# Patient Record
Sex: Male | Born: 1950 | Race: White | Hispanic: No | Marital: Single | Smoking: Never smoker
Health system: Southern US, Community
[De-identification: ages and names within clinical notes are randomized; demographics above are authoritative.]

## PROBLEM LIST (undated history)

## (undated) DIAGNOSIS — E079 Disorder of thyroid, unspecified: Secondary | ICD-10-CM

## (undated) DIAGNOSIS — M5430 Sciatica, unspecified side: Secondary | ICD-10-CM

## (undated) DIAGNOSIS — E119 Type 2 diabetes mellitus without complications: Secondary | ICD-10-CM

## (undated) DIAGNOSIS — I10 Essential (primary) hypertension: Secondary | ICD-10-CM

## (undated) HISTORY — PX: FRACTURE SURGERY: SHX138

## (undated) HISTORY — PX: NECK SURGERY: SHX720

## (undated) HISTORY — PX: HERNIA REPAIR: SHX51

## (undated) HISTORY — DX: Sciatica, unspecified side: M54.30

---

## 1998-10-10 ENCOUNTER — Ambulatory Visit (HOSPITAL_BASED_OUTPATIENT_CLINIC_OR_DEPARTMENT_OTHER): Admission: RE | Admit: 1998-10-10 | Discharge: 1998-10-10 | Payer: Self-pay | Admitting: Orthopedic Surgery

## 1999-03-31 ENCOUNTER — Emergency Department (HOSPITAL_COMMUNITY): Admission: EM | Admit: 1999-03-31 | Discharge: 1999-04-01 | Payer: Self-pay | Admitting: Emergency Medicine

## 2001-04-25 ENCOUNTER — Encounter: Payer: Self-pay | Admitting: Endocrinology

## 2001-04-25 ENCOUNTER — Ambulatory Visit (HOSPITAL_COMMUNITY): Admission: RE | Admit: 2001-04-25 | Discharge: 2001-04-25 | Payer: Self-pay | Admitting: Endocrinology

## 2001-06-08 ENCOUNTER — Inpatient Hospital Stay (HOSPITAL_COMMUNITY): Admission: EM | Admit: 2001-06-08 | Discharge: 2001-06-09 | Payer: Self-pay | Admitting: Emergency Medicine

## 2001-06-08 ENCOUNTER — Encounter: Payer: Self-pay | Admitting: Emergency Medicine

## 2001-06-08 ENCOUNTER — Encounter: Payer: Self-pay | Admitting: Orthopedic Surgery

## 2002-07-17 ENCOUNTER — Encounter: Payer: Self-pay | Admitting: Endocrinology

## 2002-07-17 ENCOUNTER — Ambulatory Visit (HOSPITAL_COMMUNITY): Admission: RE | Admit: 2002-07-17 | Discharge: 2002-07-17 | Payer: Self-pay | Admitting: Endocrinology

## 2002-10-12 ENCOUNTER — Encounter: Payer: Self-pay | Admitting: Neurosurgery

## 2002-10-18 ENCOUNTER — Ambulatory Visit (HOSPITAL_COMMUNITY): Admission: RE | Admit: 2002-10-18 | Discharge: 2002-10-19 | Payer: Self-pay | Admitting: Neurosurgery

## 2002-10-18 ENCOUNTER — Encounter: Payer: Self-pay | Admitting: Neurosurgery

## 2002-12-25 ENCOUNTER — Encounter: Admission: RE | Admit: 2002-12-25 | Discharge: 2002-12-25 | Payer: Self-pay | Admitting: Neurosurgery

## 2002-12-25 ENCOUNTER — Encounter: Payer: Self-pay | Admitting: Neurosurgery

## 2003-10-01 ENCOUNTER — Encounter: Admission: RE | Admit: 2003-10-01 | Discharge: 2003-10-01 | Payer: Self-pay | Admitting: Internal Medicine

## 2003-10-02 ENCOUNTER — Encounter: Admission: RE | Admit: 2003-10-02 | Discharge: 2003-10-02 | Payer: Self-pay | Admitting: Internal Medicine

## 2003-11-14 ENCOUNTER — Ambulatory Visit (HOSPITAL_BASED_OUTPATIENT_CLINIC_OR_DEPARTMENT_OTHER): Admission: RE | Admit: 2003-11-14 | Discharge: 2003-11-14 | Payer: Self-pay | Admitting: Orthopedic Surgery

## 2003-11-14 ENCOUNTER — Ambulatory Visit (HOSPITAL_COMMUNITY): Admission: RE | Admit: 2003-11-14 | Discharge: 2003-11-14 | Payer: Self-pay | Admitting: Orthopedic Surgery

## 2011-07-20 ENCOUNTER — Other Ambulatory Visit: Payer: Self-pay | Admitting: Neurosurgery

## 2011-07-20 DIAGNOSIS — M542 Cervicalgia: Secondary | ICD-10-CM

## 2011-07-26 ENCOUNTER — Ambulatory Visit
Admission: RE | Admit: 2011-07-26 | Discharge: 2011-07-26 | Disposition: A | Discharge status: Home / Self Care | Payer: 59 | Source: Ambulatory Visit | Attending: Neurosurgery | Admitting: Neurosurgery

## 2011-07-26 DIAGNOSIS — M542 Cervicalgia: Secondary | ICD-10-CM
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
## 2013-04-30 DIAGNOSIS — R0609 Other forms of dyspnea: Secondary | ICD-10-CM | POA: Diagnosis not present

## 2013-04-30 DIAGNOSIS — R0989 Other specified symptoms and signs involving the circulatory and respiratory systems: Secondary | ICD-10-CM | POA: Diagnosis not present

## 2013-04-30 DIAGNOSIS — E119 Type 2 diabetes mellitus without complications: Secondary | ICD-10-CM | POA: Diagnosis not present

## 2013-04-30 DIAGNOSIS — F4321 Adjustment disorder with depressed mood: Secondary | ICD-10-CM | POA: Diagnosis not present

## 2013-07-09 DIAGNOSIS — G4700 Insomnia, unspecified: Secondary | ICD-10-CM | POA: Diagnosis not present

## 2013-07-09 DIAGNOSIS — R0609 Other forms of dyspnea: Secondary | ICD-10-CM | POA: Diagnosis not present

## 2013-07-09 DIAGNOSIS — R0989 Other specified symptoms and signs involving the circulatory and respiratory systems: Secondary | ICD-10-CM | POA: Diagnosis not present

## 2013-07-09 DIAGNOSIS — E789 Disorder of lipoprotein metabolism, unspecified: Secondary | ICD-10-CM | POA: Diagnosis not present

## 2013-07-09 DIAGNOSIS — E0789 Other specified disorders of thyroid: Secondary | ICD-10-CM | POA: Diagnosis not present

## 2013-10-08 DIAGNOSIS — R0609 Other forms of dyspnea: Secondary | ICD-10-CM | POA: Diagnosis not present

## 2013-10-08 DIAGNOSIS — E0789 Other specified disorders of thyroid: Secondary | ICD-10-CM | POA: Diagnosis not present

## 2013-10-08 DIAGNOSIS — E119 Type 2 diabetes mellitus without complications: Secondary | ICD-10-CM | POA: Diagnosis not present

## 2013-11-19 DIAGNOSIS — R109 Unspecified abdominal pain: Secondary | ICD-10-CM | POA: Diagnosis not present

## 2013-11-19 DIAGNOSIS — H9190 Unspecified hearing loss, unspecified ear: Secondary | ICD-10-CM | POA: Diagnosis not present

## 2013-11-19 DIAGNOSIS — E119 Type 2 diabetes mellitus without complications: Secondary | ICD-10-CM | POA: Diagnosis not present

## 2014-01-21 DIAGNOSIS — E119 Type 2 diabetes mellitus without complications: Secondary | ICD-10-CM | POA: Diagnosis not present

## 2014-01-21 DIAGNOSIS — F4320 Adjustment disorder, unspecified: Secondary | ICD-10-CM | POA: Diagnosis not present

## 2014-01-21 DIAGNOSIS — E7889 Other lipoprotein metabolism disorders: Secondary | ICD-10-CM | POA: Diagnosis not present

## 2014-01-21 DIAGNOSIS — E039 Hypothyroidism, unspecified: Secondary | ICD-10-CM | POA: Diagnosis not present

## 2014-03-20 DIAGNOSIS — E118 Type 2 diabetes mellitus with unspecified complications: Secondary | ICD-10-CM | POA: Diagnosis not present

## 2014-03-20 DIAGNOSIS — I10 Essential (primary) hypertension: Secondary | ICD-10-CM | POA: Diagnosis not present

## 2014-08-31 ENCOUNTER — Encounter (HOSPITAL_COMMUNITY): Payer: Self-pay | Admitting: Emergency Medicine

## 2014-08-31 ENCOUNTER — Emergency Department (HOSPITAL_COMMUNITY): Payer: 59

## 2014-08-31 ENCOUNTER — Emergency Department (HOSPITAL_COMMUNITY)
Admission: EM | Admit: 2014-08-31 | Discharge: 2014-09-01 | Disposition: A | Discharge status: Home / Self Care | Payer: 59 | Attending: Emergency Medicine | Admitting: Emergency Medicine

## 2014-08-31 DIAGNOSIS — L559 Sunburn, unspecified: Secondary | ICD-10-CM | POA: Diagnosis not present

## 2014-08-31 DIAGNOSIS — Z79899 Other long term (current) drug therapy: Secondary | ICD-10-CM | POA: Diagnosis not present

## 2014-08-31 DIAGNOSIS — R1031 Right lower quadrant pain: Secondary | ICD-10-CM | POA: Insufficient documentation

## 2014-08-31 DIAGNOSIS — N400 Enlarged prostate without lower urinary tract symptoms: Secondary | ICD-10-CM | POA: Diagnosis not present

## 2014-08-31 DIAGNOSIS — E119 Type 2 diabetes mellitus without complications: Secondary | ICD-10-CM | POA: Diagnosis not present

## 2014-08-31 DIAGNOSIS — R1030 Lower abdominal pain, unspecified: Secondary | ICD-10-CM | POA: Diagnosis not present

## 2014-08-31 DIAGNOSIS — I10 Essential (primary) hypertension: Secondary | ICD-10-CM | POA: Diagnosis not present

## 2014-08-31 DIAGNOSIS — E079 Disorder of thyroid, unspecified: Secondary | ICD-10-CM | POA: Insufficient documentation

## 2014-08-31 DIAGNOSIS — R109 Unspecified abdominal pain: Secondary | ICD-10-CM

## 2014-08-31 DIAGNOSIS — N200 Calculus of kidney: Secondary | ICD-10-CM | POA: Diagnosis not present

## 2014-08-31 DIAGNOSIS — K8020 Calculus of gallbladder without cholecystitis without obstruction: Secondary | ICD-10-CM | POA: Diagnosis not present

## 2014-08-31 HISTORY — DX: Type 2 diabetes mellitus without complications: E11.9

## 2014-08-31 HISTORY — DX: Disorder of thyroid, unspecified: E07.9

## 2014-08-31 HISTORY — DX: Essential (primary) hypertension: I10

## 2014-08-31 LAB — COMPREHENSIVE METABOLIC PANEL
ALBUMIN: 4.2 g/dL (ref 3.5–5.0)
ALT: 24 U/L (ref 17–63)
AST: 29 U/L (ref 15–41)
Alkaline Phosphatase: 57 U/L (ref 38–126)
Anion gap: 11 (ref 5–15)
BUN: 15 mg/dL (ref 6–20)
CALCIUM: 9.9 mg/dL (ref 8.9–10.3)
CO2: 25 mmol/L (ref 22–32)
Chloride: 103 mmol/L (ref 101–111)
Creatinine, Ser: 0.95 mg/dL (ref 0.61–1.24)
GFR calc Af Amer: >60 mL/min (ref >60)
GFR calc non Af Amer: >60 mL/min (ref >60)
Glucose, Bld: 112 mg/dL — ABNORMAL HIGH (ref 65–99)
Potassium: 3.7 mmol/L (ref 3.5–5.1)
Sodium: 139 mmol/L (ref 135–145)
TOTAL PROTEIN: 7.7 g/dL (ref 6.5–8.1)
Total Bilirubin: 0.7 mg/dL (ref 0.3–1.2)

## 2014-08-31 LAB — URINALYSIS, ROUTINE W REFLEX MICROSCOPIC
BILIRUBIN URINE: NEGATIVE
Glucose, UA: NEGATIVE mg/dL
HGB URINE DIPSTICK: NEGATIVE
Ketones, ur: NEGATIVE mg/dL
Leukocytes, UA: NEGATIVE
NITRITE: NEGATIVE
PH: 5.5 (ref 5.0–8.0)
Protein, ur: NEGATIVE mg/dL
SPECIFIC GRAVITY, URINE: 1.007 (ref 1.005–1.030)
Urobilinogen, UA: 0.2 mg/dL (ref 0.0–1.0)

## 2014-08-31 LAB — CBC WITH DIFFERENTIAL/PLATELET
BASOS ABS: 0.1 10*3/uL (ref 0.0–0.1)
Basophils Relative: 1 % (ref 0–1)
EOS PCT: 4 % (ref 0–5)
Eosinophils Absolute: 0.4 10*3/uL (ref 0.0–0.7)
HCT: 46.3 % (ref 39.0–52.0)
Hemoglobin: 16.4 g/dL (ref 13.0–17.0)
LYMPHS ABS: 3.3 10*3/uL (ref 0.7–4.0)
Lymphocytes Relative: 30 % (ref 12–46)
MCH: 32.5 pg (ref 26.0–34.0)
MCHC: 35.4 g/dL (ref 30.0–36.0)
MCV: 91.7 fL (ref 78.0–100.0)
Monocytes Absolute: 0.7 10*3/uL (ref 0.1–1.0)
Monocytes Relative: 7 % (ref 3–12)
Neutro Abs: 6.6 10*3/uL (ref 1.7–7.7)
Neutrophils Relative %: 60 % (ref 43–77)
Platelets: 160 10*3/uL (ref 150–400)
RBC: 5.05 MIL/uL (ref 4.22–5.81)
RDW: 13.3 % (ref 11.5–15.5)
WBC: 11.1 10*3/uL — ABNORMAL HIGH (ref 4.0–10.5)

## 2014-08-31 LAB — LIPASE, BLOOD: LIPASE: 37 U/L (ref 22–51)

## 2014-08-31 MED ORDER — SODIUM CHLORIDE 0.9 % IV BOLUS (SEPSIS)
1000.0000 mL | Freq: Once | INTRAVENOUS | Status: AC
  Administered 2014-08-31: 1000 mL via INTRAVENOUS

## 2014-08-31 MED ORDER — MORPHINE SULFATE 2 MG/ML IJ SOLN
2.0000 mg | Freq: Once | INTRAMUSCULAR | Status: AC
  Administered 2014-08-31: 2 mg via INTRAVENOUS
  Filled 2014-08-31: qty 1

## 2014-08-31 MED ORDER — ONDANSETRON HCL 4 MG/2ML IJ SOLN
4.0000 mg | Freq: Once | INTRAMUSCULAR | Status: AC
  Administered 2014-08-31: 4 mg via INTRAVENOUS
  Filled 2014-08-31: qty 2

## 2014-08-31 NOTE — ED Notes (Signed)
Denies any abd surgeries.  Sts the pain is evolving and getting worse.

## 2014-08-31 NOTE — ED Notes (Signed)
Back from CT

## 2014-08-31 NOTE — ED Notes (Signed)
Pt reports pain comes and goes but it's about 1/10 right now

## 2014-08-31 NOTE — ED Provider Notes (Signed)
CSN: [id number]     Arrival date & time 08/31/14  2027 History   First MD Initiated Contact with Patient 08/31/14 2031     Chief Complaint  Patient presents with  . Abdominal Pain     (Consider location/radiation/quality/duration/timing/severity/associated sxs/prior Treatment) The history is provided by the patient.   Mr. Cephas is a 64 year old male with PMHx of hypertension, diabetes, and HLD who presented to the emergency department today after four hours of acute onset right-sided abdominal pain.   The quality of the pain is described as achy and at its worst sharp with severity 5/10. There is some associated right flank pain.  He was out in the sun today for about four hours, he had three beers, but also drank water and sweet tea.  He ate a hot dog two hours prior to coming to the ER without any change in pain.  The pain seems to come and go on it's own without any relation to food or position.  He denies any nausea, vomiting, diarrhea, fever and chills.  He had a normal bowel movement two days ago, but has not gone since.  He is passing gas.  He urinated immediately before coming to the ER and denies any hematuria or urine color change.  He has no history of abdominal surgery, kidney stones, GERD, diverticulosis/diverticulitis, gallstones, or any other abdominal issues.  He has no headache, chest pain, or shortness of breath.   Past Medical History  Diagnosis Date  . Hypertension   . Thyroid disease   . Diabetes mellitus without complication    Past Surgical History  Procedure Laterality Date  . Fracture surgery     No family history on file. History  Substance Use Topics  . Smoking status: Never Smoker   . Smokeless tobacco: Not on file  . Alcohol Use: Yes    Review of Systems  All other systems reviewed and are negative.     Allergies  Review of patient's allergies indicates no known allergies.  Home Medications   Prior to Admission medications   Medication Sig  Start Date End Date Taking? Authorizing Provider  amLODipine (NORVASC) 5 MG tablet Take 2.5 mg by mouth daily.   Yes Historical Provider, MD  CRESTOR 20 MG tablet Take 20 mg by mouth daily. 07/18/14  Yes Historical Provider, MD  sitaGLIPtin-metformin (JANUMET) 50-1000 MG per tablet Take 1 tablet by mouth daily.   Yes Historical Provider, MD  SYNTHROID 137 MCG tablet Take 137 mcg by mouth daily. 08/05/14  Yes Historical Provider, MD  VIAGRA 100 MG tablet Take 50-100 mg by mouth daily as needed for erectile dysfunction.  07/19/14  Yes Historical Provider, MD  dicyclomine (BENTYL) 20 MG tablet Take 1 tablet (20 mg total) by mouth 2 (two) times daily. 09/01/14   Danelle Berry, PA-C  ketorolac (TORADOL) 10 MG tablet Take 1 tablet (10 mg total) by mouth every 6 (six) hours as needed for severe pain. 09/01/14   Danelle Berry, PA-C  ondansetron (ZOFRAN ODT) 4 MG disintegrating tablet Take 1 tablet (4 mg total) by mouth every 8 (eight) hours as needed for nausea or vomiting. 09/01/14   Danelle Berry, PA-C   BP 145/76 mmHg  Pulse 74  Temp(Src) 97.7 F (36.5 C) (Oral)  Resp 16  SpO2 95% Physical Exam  Constitutional: He is oriented to person, place, and time. Vital signs are normal. He appears well-developed and well-nourished. No distress.  HENT:  Head: Normocephalic and atraumatic.  Nose: Nose  normal.  Mouth/Throat: Oropharynx is clear and moist. No oropharyngeal exudate.  Eyes: Conjunctivae and EOM are normal. Pupils are equal, round, and reactive to light. Right eye exhibits no discharge. Left eye exhibits no discharge. No scleral icterus.  Neck: Normal range of motion. No JVD present. No tracheal deviation present. No thyromegaly present.  Cardiovascular: Normal rate, regular rhythm, normal heart sounds and intact distal pulses.  Exam reveals no gallop and no friction rub.   No murmur heard. Pulmonary/Chest: Effort normal and breath sounds normal. No accessory muscle usage. No tachypnea. No respiratory  distress. He has no decreased breath sounds. He has no wheezes. He has no rhonchi. He has no rales. He exhibits no tenderness.  Abdominal: Soft. Normal appearance and bowel sounds are normal. He exhibits no shifting dullness, no distension, no abdominal bruit, no ascites, no pulsatile midline mass and no mass. There is no hepatomegaly. There is tenderness in the right lower quadrant and suprapubic area. There is no rigidity, no rebound, no guarding, no CVA tenderness and negative Murphy's sign.  Abdomen tender to deep palpation in RLQ and minimally tender over suprapubic area.  No rosving or obturator sign.    Musculoskeletal: Normal range of motion. He exhibits no edema or tenderness.  Lymphadenopathy:    He has no cervical adenopathy.  Neurological: He is alert and oriented to person, place, and time. He has normal reflexes. No cranial nerve deficit. He exhibits normal muscle tone. Coordination normal.  Skin: Skin is warm, dry and intact. No rash noted. He is not diaphoretic. No erythema. No pallor.  Sun burn on abdomen  Psychiatric: He has a normal mood and affect. His behavior is normal. Judgment and thought content normal.  Nursing note and vitals reviewed.   ED Course  Procedures (including critical care time) Labs Review Labs Reviewed  CBC WITH DIFFERENTIAL/PLATELET - Abnormal; Notable for the following:    WBC 11.1 (*)    All other components within normal limits  COMPREHENSIVE METABOLIC PANEL - Abnormal; Notable for the following:    Glucose, Bld 112 (*)    All other components within normal limits  LIPASE, BLOOD  URINALYSIS, ROUTINE W REFLEX MICROSCOPIC (NOT [facility])    Imaging Review Ct Renal Stone Study  08/31/2014   CLINICAL DATA:  Acute onset of right lower quadrant abdominal pain. Initial encounter.  EXAM: CT ABDOMEN AND PELVIS WITHOUT CONTRAST  TECHNIQUE: Multidetector CT imaging of the abdomen and pelvis was performed following the standard protocol without IV contrast.   COMPARISON:  None.  FINDINGS: Minimal right basilar scarring is noted. Diffuse coronary artery calcifications are seen.  Scattered hypodensities within the liver measure up to 2.8 cm in size, nonspecific in appearance but likely reflecting small cysts. A small stone is noted within the gallbladder; the gallbladder is otherwise unremarkable. The pancreas and adrenal glands are unremarkable.  Mild nonspecific perinephric stranding is noted bilaterally. A 3 mm stone is noted at the interpole region of the right kidney. No obstructing ureteral stones are identified. There is no evidence of hydronephrosis.  No free fluid is identified. The small bowel is unremarkable in appearance. The stomach is within normal limits. No acute vascular abnormalities are seen. Relatively diffuse calcification is seen along the abdominal aorta and its branches.  The appendix is not characterized; there is no evidence of appendicitis. The colon is unremarkable in appearance.  The bladder is mildly distended and grossly unremarkable in appearance. The prostate is enlarged, measuring 5.4 cm in transverse dimension. No  inguinal lymphadenopathy is seen.  No acute osseous abnormalities are identified.  IMPRESSION: 1. No acute abnormality seen to explain the patient's symptoms. 2. Diffuse coronary artery calcifications seen. 3. Scattered nonspecific hypodensities throughout the liver measure up to 2.8 cm in size. These likely reflect small cysts. 4. Cholelithiasis; gallbladder otherwise unremarkable. 5. 3 mm nonobstructing stone at the interpole region of the right kidney. 6. Relatively diffuse calcification along the abdominal aorta and its branches. 7. Enlarged prostate.   Electronically Signed   By: Roanna Raider M.D.   On: 08/31/2014 22:50     EKG Interpretation None      MDM   Final diagnoses:  Abdominal pain, acute    Abd pain, intermittent Plan: Treat pain/nausea, IVF, Renal CT scan  Work up reveals CT - multiple  hypodensities in liver, cholelithiasis present with normal LFT's, 3mm renal calculus present with normal UA, no findings for acute abdominal pain. Pt has been stable without tachycardia, nausea or vomiting.  Pt tolerating intermittent pain, and declined a second dose of medicine.  Has agreed to follow-up on multiple CT findings with primary care provider, D/C'd home    Danelle Berry, PA-C 09/02/14 0302

## 2014-08-31 NOTE — ED Provider Notes (Signed)
Medical screening examination/treatment/procedure(s) were conducted as a shared visit with non-physician practitioner(s) and myself.  I personally evaluated the patient during the encounter.  No abdominal ttp on my exam.  Labs and ct are reassuring.  At this time there does not appear to be any evidence of an acute emergency medical condition and the patient appears stable for discharge with appropriate outpatient follow up.   Linwood DibblesJon Fauna Neuner, MD 08/31/14 [phone]

## 2014-08-31 NOTE — ED Notes (Signed)
Pt c/o sudden onset RLQ pain time 3-4 hours. Pt denies N/V/D, fevers, urinary symptoms, chest pain, dizziness, lightheadedness, SOB. A&Ox4 and ambulatory. Pt denies hx of similar pain. Pt had a normal bowel movement two days ago, was unable to go today. Pt has been passing gas.

## 2014-09-01 MED ORDER — DICYCLOMINE HCL 20 MG PO TABS: 20.0000 mg | ORAL_TABLET | Freq: Two times a day (BID) | ORAL | Status: DC

## 2014-09-01 MED ORDER — ONDANSETRON 4 MG PO TBDP: 4.0000 mg | ORAL_TABLET | Freq: Three times a day (TID) | ORAL | Status: DC | PRN

## 2014-09-01 MED ORDER — KETOROLAC TROMETHAMINE 10 MG PO TABS: 10.0000 mg | ORAL_TABLET | Freq: Four times a day (QID) | ORAL | Status: DC | PRN

## 2014-09-01 NOTE — Discharge Instructions (Signed)

## 2014-09-01 NOTE — ED Notes (Signed)
Patient is alert and oriented x3.  He was given DC instructions and follow up visit instructions.  Patient gave verbal understanding.  He was DC ambulatory under his own power to home.  V/S stable.  He was not showing any signs of distress on DC 

## 2014-11-04 DIAGNOSIS — K5901 Slow transit constipation: Secondary | ICD-10-CM | POA: Diagnosis not present

## 2014-11-04 DIAGNOSIS — E118 Type 2 diabetes mellitus with unspecified complications: Secondary | ICD-10-CM | POA: Diagnosis not present

## 2014-11-04 DIAGNOSIS — R0689 Other abnormalities of breathing: Secondary | ICD-10-CM | POA: Diagnosis not present

## 2014-11-04 DIAGNOSIS — I10 Essential (primary) hypertension: Secondary | ICD-10-CM | POA: Diagnosis not present

## 2014-11-21 DIAGNOSIS — H903 Sensorineural hearing loss, bilateral: Secondary | ICD-10-CM | POA: Diagnosis not present

## 2014-11-27 DIAGNOSIS — H903 Sensorineural hearing loss, bilateral: Secondary | ICD-10-CM | POA: Diagnosis not present

## 2015-01-23 DIAGNOSIS — Z23 Encounter for immunization: Secondary | ICD-10-CM | POA: Diagnosis not present

## 2015-01-23 DIAGNOSIS — E039 Hypothyroidism, unspecified: Secondary | ICD-10-CM | POA: Diagnosis not present

## 2015-01-23 DIAGNOSIS — E789 Disorder of lipoprotein metabolism, unspecified: Secondary | ICD-10-CM | POA: Diagnosis not present

## 2015-01-23 DIAGNOSIS — R0602 Shortness of breath: Secondary | ICD-10-CM | POA: Diagnosis not present

## 2015-04-19 DIAGNOSIS — J029 Acute pharyngitis, unspecified: Secondary | ICD-10-CM | POA: Diagnosis not present

## 2015-05-01 DIAGNOSIS — E789 Disorder of lipoprotein metabolism, unspecified: Secondary | ICD-10-CM | POA: Diagnosis not present

## 2015-05-01 DIAGNOSIS — E119 Type 2 diabetes mellitus without complications: Secondary | ICD-10-CM | POA: Diagnosis not present

## 2015-05-01 DIAGNOSIS — E034 Atrophy of thyroid (acquired): Secondary | ICD-10-CM | POA: Diagnosis not present

## 2015-05-08 DIAGNOSIS — E118 Type 2 diabetes mellitus with unspecified complications: Secondary | ICD-10-CM | POA: Diagnosis not present

## 2015-05-08 DIAGNOSIS — E032 Hypothyroidism due to medicaments and other exogenous substances: Secondary | ICD-10-CM | POA: Diagnosis not present

## 2015-05-08 DIAGNOSIS — I10 Essential (primary) hypertension: Secondary | ICD-10-CM | POA: Diagnosis not present

## 2015-05-08 DIAGNOSIS — E789 Disorder of lipoprotein metabolism, unspecified: Secondary | ICD-10-CM | POA: Diagnosis not present

## 2015-08-25 DIAGNOSIS — E119 Type 2 diabetes mellitus without complications: Secondary | ICD-10-CM | POA: Diagnosis not present

## 2015-09-04 DIAGNOSIS — I10 Essential (primary) hypertension: Secondary | ICD-10-CM | POA: Diagnosis not present

## 2015-09-04 DIAGNOSIS — E118 Type 2 diabetes mellitus with unspecified complications: Secondary | ICD-10-CM | POA: Diagnosis not present

## 2015-09-04 DIAGNOSIS — E789 Disorder of lipoprotein metabolism, unspecified: Secondary | ICD-10-CM | POA: Diagnosis not present

## 2015-11-28 ENCOUNTER — Other Ambulatory Visit: Payer: Self-pay

## 2015-12-26 DIAGNOSIS — Z961 Presence of intraocular lens: Secondary | ICD-10-CM | POA: Diagnosis not present

## 2015-12-26 DIAGNOSIS — E119 Type 2 diabetes mellitus without complications: Secondary | ICD-10-CM | POA: Diagnosis not present

## 2015-12-26 DIAGNOSIS — H04213 Epiphora due to excess lacrimation, bilateral lacrimal glands: Secondary | ICD-10-CM | POA: Diagnosis not present

## 2015-12-26 DIAGNOSIS — H04123 Dry eye syndrome of bilateral lacrimal glands: Secondary | ICD-10-CM | POA: Diagnosis not present

## 2016-01-01 DIAGNOSIS — I10 Essential (primary) hypertension: Secondary | ICD-10-CM | POA: Diagnosis not present

## 2016-01-01 DIAGNOSIS — E034 Atrophy of thyroid (acquired): Secondary | ICD-10-CM | POA: Diagnosis not present

## 2016-01-01 DIAGNOSIS — E118 Type 2 diabetes mellitus with unspecified complications: Secondary | ICD-10-CM | POA: Diagnosis not present

## 2016-01-01 DIAGNOSIS — E119 Type 2 diabetes mellitus without complications: Secondary | ICD-10-CM | POA: Diagnosis not present

## 2016-01-08 DIAGNOSIS — K5900 Constipation, unspecified: Secondary | ICD-10-CM | POA: Diagnosis not present

## 2016-01-08 DIAGNOSIS — I10 Essential (primary) hypertension: Secondary | ICD-10-CM | POA: Diagnosis not present

## 2016-01-08 DIAGNOSIS — E118 Type 2 diabetes mellitus with unspecified complications: Secondary | ICD-10-CM | POA: Diagnosis not present

## 2016-01-08 DIAGNOSIS — Z23 Encounter for immunization: Secondary | ICD-10-CM | POA: Diagnosis not present

## 2016-03-09 DIAGNOSIS — E034 Atrophy of thyroid (acquired): Secondary | ICD-10-CM | POA: Diagnosis not present

## 2016-03-09 DIAGNOSIS — I10 Essential (primary) hypertension: Secondary | ICD-10-CM | POA: Diagnosis not present

## 2016-03-09 DIAGNOSIS — E119 Type 2 diabetes mellitus without complications: Secondary | ICD-10-CM | POA: Diagnosis not present

## 2016-03-16 DIAGNOSIS — E118 Type 2 diabetes mellitus with unspecified complications: Secondary | ICD-10-CM | POA: Diagnosis not present

## 2016-03-16 DIAGNOSIS — I10 Essential (primary) hypertension: Secondary | ICD-10-CM | POA: Diagnosis not present

## 2016-03-16 DIAGNOSIS — E789 Disorder of lipoprotein metabolism, unspecified: Secondary | ICD-10-CM | POA: Diagnosis not present

## 2016-03-16 DIAGNOSIS — E032 Hypothyroidism due to medicaments and other exogenous substances: Secondary | ICD-10-CM | POA: Diagnosis not present

## 2016-05-18 DIAGNOSIS — E118 Type 2 diabetes mellitus with unspecified complications: Secondary | ICD-10-CM | POA: Diagnosis not present

## 2016-05-18 DIAGNOSIS — E032 Hypothyroidism due to medicaments and other exogenous substances: Secondary | ICD-10-CM | POA: Diagnosis not present

## 2016-05-18 DIAGNOSIS — E789 Disorder of lipoprotein metabolism, unspecified: Secondary | ICD-10-CM | POA: Diagnosis not present

## 2016-06-08 DIAGNOSIS — Z125 Encounter for screening for malignant neoplasm of prostate: Secondary | ICD-10-CM | POA: Diagnosis not present

## 2016-06-08 DIAGNOSIS — E119 Type 2 diabetes mellitus without complications: Secondary | ICD-10-CM | POA: Diagnosis not present

## 2016-06-08 DIAGNOSIS — I10 Essential (primary) hypertension: Secondary | ICD-10-CM | POA: Diagnosis not present

## 2016-06-08 DIAGNOSIS — E034 Atrophy of thyroid (acquired): Secondary | ICD-10-CM | POA: Diagnosis not present

## 2016-06-16 DIAGNOSIS — E118 Type 2 diabetes mellitus with unspecified complications: Secondary | ICD-10-CM | POA: Diagnosis not present

## 2016-06-16 DIAGNOSIS — M549 Dorsalgia, unspecified: Secondary | ICD-10-CM | POA: Diagnosis not present

## 2016-06-16 DIAGNOSIS — E039 Hypothyroidism, unspecified: Secondary | ICD-10-CM | POA: Diagnosis not present

## 2016-06-16 DIAGNOSIS — M48061 Spinal stenosis, lumbar region without neurogenic claudication: Secondary | ICD-10-CM | POA: Diagnosis not present

## 2016-06-17 DIAGNOSIS — E119 Type 2 diabetes mellitus without complications: Secondary | ICD-10-CM | POA: Diagnosis not present

## 2016-06-17 DIAGNOSIS — M6208 Separation of muscle (nontraumatic), other site: Secondary | ICD-10-CM | POA: Diagnosis not present

## 2016-06-17 DIAGNOSIS — K429 Umbilical hernia without obstruction or gangrene: Secondary | ICD-10-CM | POA: Diagnosis not present

## 2016-06-17 DIAGNOSIS — K439 Ventral hernia without obstruction or gangrene: Secondary | ICD-10-CM | POA: Diagnosis not present

## 2016-06-22 DIAGNOSIS — E118 Type 2 diabetes mellitus with unspecified complications: Secondary | ICD-10-CM | POA: Diagnosis not present

## 2016-07-19 DIAGNOSIS — K429 Umbilical hernia without obstruction or gangrene: Secondary | ICD-10-CM | POA: Diagnosis not present

## 2016-07-19 DIAGNOSIS — K439 Ventral hernia without obstruction or gangrene: Secondary | ICD-10-CM | POA: Diagnosis not present

## 2016-12-28 DIAGNOSIS — E079 Disorder of thyroid, unspecified: Secondary | ICD-10-CM | POA: Diagnosis not present

## 2016-12-28 DIAGNOSIS — R918 Other nonspecific abnormal finding of lung field: Secondary | ICD-10-CM | POA: Diagnosis not present

## 2016-12-28 DIAGNOSIS — M47896 Other spondylosis, lumbar region: Secondary | ICD-10-CM | POA: Diagnosis not present

## 2016-12-28 DIAGNOSIS — W0110XA Fall on same level from slipping, tripping and stumbling with subsequent striking against unspecified object, initial encounter: Secondary | ICD-10-CM | POA: Diagnosis not present

## 2016-12-28 DIAGNOSIS — Z7984 Long term (current) use of oral hypoglycemic drugs: Secondary | ICD-10-CM | POA: Diagnosis not present

## 2016-12-28 DIAGNOSIS — E119 Type 2 diabetes mellitus without complications: Secondary | ICD-10-CM | POA: Diagnosis not present

## 2016-12-28 DIAGNOSIS — Z79899 Other long term (current) drug therapy: Secondary | ICD-10-CM | POA: Diagnosis not present

## 2016-12-28 DIAGNOSIS — S20221A Contusion of right back wall of thorax, initial encounter: Secondary | ICD-10-CM | POA: Diagnosis not present

## 2016-12-28 DIAGNOSIS — S3992XA Unspecified injury of lower back, initial encounter: Secondary | ICD-10-CM | POA: Diagnosis not present

## 2016-12-28 DIAGNOSIS — M4126 Other idiopathic scoliosis, lumbar region: Secondary | ICD-10-CM | POA: Diagnosis not present

## 2016-12-28 DIAGNOSIS — R0789 Other chest pain: Secondary | ICD-10-CM | POA: Diagnosis not present

## 2016-12-28 DIAGNOSIS — S20211A Contusion of right front wall of thorax, initial encounter: Secondary | ICD-10-CM | POA: Diagnosis not present

## 2016-12-28 DIAGNOSIS — I10 Essential (primary) hypertension: Secondary | ICD-10-CM | POA: Diagnosis not present

## 2016-12-28 DIAGNOSIS — R937 Abnormal findings on diagnostic imaging of other parts of musculoskeletal system: Secondary | ICD-10-CM | POA: Diagnosis not present

## 2016-12-28 DIAGNOSIS — S300XXA Contusion of lower back and pelvis, initial encounter: Secondary | ICD-10-CM | POA: Diagnosis not present

## 2016-12-28 DIAGNOSIS — S299XXA Unspecified injury of thorax, initial encounter: Secondary | ICD-10-CM | POA: Diagnosis not present

## 2017-01-03 DIAGNOSIS — H02403 Unspecified ptosis of bilateral eyelids: Secondary | ICD-10-CM | POA: Diagnosis not present

## 2017-01-03 DIAGNOSIS — E119 Type 2 diabetes mellitus without complications: Secondary | ICD-10-CM | POA: Diagnosis not present

## 2017-01-03 DIAGNOSIS — H04123 Dry eye syndrome of bilateral lacrimal glands: Secondary | ICD-10-CM | POA: Diagnosis not present

## 2017-01-03 DIAGNOSIS — H53149 Visual discomfort, unspecified: Secondary | ICD-10-CM | POA: Diagnosis not present

## 2017-04-08 DIAGNOSIS — E119 Type 2 diabetes mellitus without complications: Secondary | ICD-10-CM | POA: Diagnosis not present

## 2017-04-08 DIAGNOSIS — E291 Testicular hypofunction: Secondary | ICD-10-CM | POA: Diagnosis not present

## 2017-04-08 DIAGNOSIS — N529 Male erectile dysfunction, unspecified: Secondary | ICD-10-CM | POA: Diagnosis not present

## 2017-04-08 DIAGNOSIS — E785 Hyperlipidemia, unspecified: Secondary | ICD-10-CM | POA: Diagnosis not present

## 2017-04-08 DIAGNOSIS — E039 Hypothyroidism, unspecified: Secondary | ICD-10-CM | POA: Diagnosis not present

## 2017-04-08 DIAGNOSIS — M1990 Unspecified osteoarthritis, unspecified site: Secondary | ICD-10-CM | POA: Diagnosis not present

## 2017-04-08 DIAGNOSIS — I10 Essential (primary) hypertension: Secondary | ICD-10-CM | POA: Diagnosis not present

## 2017-04-22 DIAGNOSIS — E119 Type 2 diabetes mellitus without complications: Secondary | ICD-10-CM | POA: Diagnosis not present

## 2017-04-22 DIAGNOSIS — E034 Atrophy of thyroid (acquired): Secondary | ICD-10-CM | POA: Diagnosis not present

## 2017-04-22 DIAGNOSIS — I10 Essential (primary) hypertension: Secondary | ICD-10-CM | POA: Diagnosis not present

## 2017-04-26 DIAGNOSIS — I10 Essential (primary) hypertension: Secondary | ICD-10-CM | POA: Diagnosis not present

## 2017-04-26 DIAGNOSIS — E785 Hyperlipidemia, unspecified: Secondary | ICD-10-CM | POA: Diagnosis not present

## 2017-04-26 DIAGNOSIS — E119 Type 2 diabetes mellitus without complications: Secondary | ICD-10-CM | POA: Diagnosis not present

## 2017-04-26 DIAGNOSIS — R809 Proteinuria, unspecified: Secondary | ICD-10-CM | POA: Diagnosis not present

## 2017-04-26 DIAGNOSIS — E039 Hypothyroidism, unspecified: Secondary | ICD-10-CM | POA: Diagnosis not present

## 2017-05-03 DIAGNOSIS — E119 Type 2 diabetes mellitus without complications: Secondary | ICD-10-CM | POA: Diagnosis not present

## 2017-05-03 DIAGNOSIS — I10 Essential (primary) hypertension: Secondary | ICD-10-CM | POA: Diagnosis not present

## 2017-06-07 DIAGNOSIS — E039 Hypothyroidism, unspecified: Secondary | ICD-10-CM | POA: Diagnosis not present

## 2017-06-07 DIAGNOSIS — I10 Essential (primary) hypertension: Secondary | ICD-10-CM | POA: Diagnosis not present

## 2017-06-07 DIAGNOSIS — E119 Type 2 diabetes mellitus without complications: Secondary | ICD-10-CM | POA: Diagnosis not present

## 2017-06-07 DIAGNOSIS — E559 Vitamin D deficiency, unspecified: Secondary | ICD-10-CM | POA: Diagnosis not present

## 2017-06-14 DIAGNOSIS — I10 Essential (primary) hypertension: Secondary | ICD-10-CM | POA: Diagnosis not present

## 2017-06-14 DIAGNOSIS — R809 Proteinuria, unspecified: Secondary | ICD-10-CM | POA: Diagnosis not present

## 2017-06-14 DIAGNOSIS — E039 Hypothyroidism, unspecified: Secondary | ICD-10-CM | POA: Diagnosis not present

## 2017-06-14 DIAGNOSIS — E785 Hyperlipidemia, unspecified: Secondary | ICD-10-CM | POA: Diagnosis not present

## 2017-06-14 DIAGNOSIS — E119 Type 2 diabetes mellitus without complications: Secondary | ICD-10-CM | POA: Diagnosis not present

## 2017-07-01 DIAGNOSIS — E034 Atrophy of thyroid (acquired): Secondary | ICD-10-CM | POA: Diagnosis not present

## 2017-07-01 DIAGNOSIS — Z125 Encounter for screening for malignant neoplasm of prostate: Secondary | ICD-10-CM | POA: Diagnosis not present

## 2017-07-01 DIAGNOSIS — I10 Essential (primary) hypertension: Secondary | ICD-10-CM | POA: Diagnosis not present

## 2017-07-01 DIAGNOSIS — E119 Type 2 diabetes mellitus without complications: Secondary | ICD-10-CM | POA: Diagnosis not present

## 2017-07-07 DIAGNOSIS — E785 Hyperlipidemia, unspecified: Secondary | ICD-10-CM | POA: Diagnosis not present

## 2017-07-07 DIAGNOSIS — I10 Essential (primary) hypertension: Secondary | ICD-10-CM | POA: Diagnosis not present

## 2017-07-07 DIAGNOSIS — E039 Hypothyroidism, unspecified: Secondary | ICD-10-CM | POA: Diagnosis not present

## 2017-07-07 DIAGNOSIS — Z79899 Other long term (current) drug therapy: Secondary | ICD-10-CM | POA: Diagnosis not present

## 2017-07-08 DIAGNOSIS — K439 Ventral hernia without obstruction or gangrene: Secondary | ICD-10-CM | POA: Diagnosis not present

## 2017-07-08 DIAGNOSIS — E559 Vitamin D deficiency, unspecified: Secondary | ICD-10-CM | POA: Diagnosis not present

## 2017-07-08 DIAGNOSIS — E119 Type 2 diabetes mellitus without complications: Secondary | ICD-10-CM | POA: Diagnosis not present

## 2017-07-08 DIAGNOSIS — E039 Hypothyroidism, unspecified: Secondary | ICD-10-CM | POA: Diagnosis not present

## 2017-07-08 DIAGNOSIS — I10 Essential (primary) hypertension: Secondary | ICD-10-CM | POA: Diagnosis not present

## 2017-07-08 DIAGNOSIS — Z0000 Encounter for general adult medical examination without abnormal findings: Secondary | ICD-10-CM | POA: Diagnosis not present

## 2017-07-08 DIAGNOSIS — E538 Deficiency of other specified B group vitamins: Secondary | ICD-10-CM | POA: Diagnosis not present

## 2017-09-06 DIAGNOSIS — E039 Hypothyroidism, unspecified: Secondary | ICD-10-CM | POA: Diagnosis not present

## 2017-09-06 DIAGNOSIS — E538 Deficiency of other specified B group vitamins: Secondary | ICD-10-CM | POA: Diagnosis not present

## 2017-09-06 DIAGNOSIS — E119 Type 2 diabetes mellitus without complications: Secondary | ICD-10-CM | POA: Diagnosis not present

## 2017-09-06 DIAGNOSIS — E58 Dietary calcium deficiency: Secondary | ICD-10-CM | POA: Diagnosis not present

## 2017-09-13 DIAGNOSIS — R809 Proteinuria, unspecified: Secondary | ICD-10-CM | POA: Diagnosis not present

## 2017-09-13 DIAGNOSIS — E785 Hyperlipidemia, unspecified: Secondary | ICD-10-CM | POA: Diagnosis not present

## 2017-09-13 DIAGNOSIS — E559 Vitamin D deficiency, unspecified: Secondary | ICD-10-CM | POA: Diagnosis not present

## 2017-09-13 DIAGNOSIS — E039 Hypothyroidism, unspecified: Secondary | ICD-10-CM | POA: Diagnosis not present

## 2017-09-13 DIAGNOSIS — E119 Type 2 diabetes mellitus without complications: Secondary | ICD-10-CM | POA: Diagnosis not present

## 2017-09-13 DIAGNOSIS — I10 Essential (primary) hypertension: Secondary | ICD-10-CM | POA: Diagnosis not present

## 2017-10-02 ENCOUNTER — Emergency Department (HOSPITAL_COMMUNITY): Payer: Medicare Other

## 2017-10-02 ENCOUNTER — Encounter (HOSPITAL_COMMUNITY): Payer: Self-pay | Admitting: Emergency Medicine

## 2017-10-02 ENCOUNTER — Emergency Department (HOSPITAL_COMMUNITY)
Admission: EM | Admit: 2017-10-02 | Discharge: 2017-10-02 | Disposition: A | Discharge status: Home / Self Care | Payer: Medicare Other | Attending: Emergency Medicine | Admitting: Emergency Medicine

## 2017-10-02 DIAGNOSIS — E119 Type 2 diabetes mellitus without complications: Secondary | ICD-10-CM | POA: Insufficient documentation

## 2017-10-02 DIAGNOSIS — T887XXA Unspecified adverse effect of drug or medicament, initial encounter: Secondary | ICD-10-CM | POA: Diagnosis not present

## 2017-10-02 DIAGNOSIS — R05 Cough: Secondary | ICD-10-CM | POA: Insufficient documentation

## 2017-10-02 DIAGNOSIS — Z7984 Long term (current) use of oral hypoglycemic drugs: Secondary | ICD-10-CM | POA: Insufficient documentation

## 2017-10-02 DIAGNOSIS — I10 Essential (primary) hypertension: Secondary | ICD-10-CM | POA: Diagnosis not present

## 2017-10-02 DIAGNOSIS — Z7982 Long term (current) use of aspirin: Secondary | ICD-10-CM | POA: Insufficient documentation

## 2017-10-02 DIAGNOSIS — T50905A Adverse effect of unspecified drugs, medicaments and biological substances, initial encounter: Secondary | ICD-10-CM | POA: Insufficient documentation

## 2017-10-02 DIAGNOSIS — Z79899 Other long term (current) drug therapy: Secondary | ICD-10-CM | POA: Insufficient documentation

## 2017-10-02 DIAGNOSIS — R0789 Other chest pain: Secondary | ICD-10-CM | POA: Diagnosis not present

## 2017-10-02 DIAGNOSIS — R5383 Other fatigue: Secondary | ICD-10-CM | POA: Diagnosis not present

## 2017-10-02 DIAGNOSIS — Y69 Unspecified misadventure during surgical and medical care: Secondary | ICD-10-CM | POA: Insufficient documentation

## 2017-10-02 DIAGNOSIS — T465X5A Adverse effect of other antihypertensive drugs, initial encounter: Secondary | ICD-10-CM | POA: Diagnosis not present

## 2017-10-02 LAB — COMPREHENSIVE METABOLIC PANEL
ALK PHOS: 55 U/L (ref 38–126)
ALT: 15 U/L (ref 0–44)
ANION GAP: 9 (ref 5–15)
AST: 19 U/L (ref 15–41)
Albumin: 4.2 g/dL (ref 3.5–5.0)
BILIRUBIN TOTAL: 1.4 mg/dL — AB (ref 0.3–1.2)
BUN: 11 mg/dL (ref 8–23)
CALCIUM: 9.7 mg/dL (ref 8.9–10.3)
CO2: 26 mmol/L (ref 22–32)
Chloride: 103 mmol/L (ref 98–111)
Creatinine, Ser: 1.17 mg/dL (ref 0.61–1.24)
GFR calc Af Amer: >60 mL/min (ref >60)
GFR calc non Af Amer: >60 mL/min (ref >60)
Glucose, Bld: 130 mg/dL — ABNORMAL HIGH (ref 70–99)
POTASSIUM: 4.1 mmol/L (ref 3.5–5.1)
Sodium: 138 mmol/L (ref 135–145)
Total Protein: 7.8 g/dL (ref 6.5–8.1)

## 2017-10-02 LAB — CBC WITH DIFFERENTIAL/PLATELET
BASOS PCT: 0 %
Basophils Absolute: 0 10*3/uL (ref 0.0–0.1)
EOS PCT: 3 %
Eosinophils Absolute: 0.4 10*3/uL (ref 0.0–0.7)
HCT: 47.9 % (ref 39.0–52.0)
Hemoglobin: 17.3 g/dL — ABNORMAL HIGH (ref 13.0–17.0)
LYMPHS PCT: 17 %
Lymphs Abs: 2.1 10*3/uL (ref 0.7–4.0)
MCH: 32.4 pg (ref 26.0–34.0)
MCHC: 36.1 g/dL — ABNORMAL HIGH (ref 30.0–36.0)
MCV: 89.7 fL (ref 78.0–100.0)
Monocytes Absolute: 0.8 10*3/uL (ref 0.1–1.0)
Monocytes Relative: 7 %
NEUTROS ABS: 8.8 10*3/uL — AB (ref 1.7–7.7)
Neutrophils Relative %: 73 %
Platelets: 175 10*3/uL (ref 150–400)
RBC: 5.34 MIL/uL (ref 4.22–5.81)
RDW: 13.6 % (ref 11.5–15.5)
WBC: 12.1 10*3/uL — ABNORMAL HIGH (ref 4.0–10.5)

## 2017-10-02 LAB — URINALYSIS, ROUTINE W REFLEX MICROSCOPIC
Bilirubin Urine: NEGATIVE
GLUCOSE, UA: NEGATIVE mg/dL
Hgb urine dipstick: NEGATIVE
KETONES UR: NEGATIVE mg/dL
LEUKOCYTES UA: NEGATIVE
Nitrite: NEGATIVE
Protein, ur: NEGATIVE mg/dL
Specific Gravity, Urine: 1.008 (ref 1.005–1.030)
pH: 6 (ref 5.0–8.0)

## 2017-10-02 LAB — TROPONIN I

## 2017-10-02 LAB — GROUP A STREP BY PCR: GROUP A STREP BY PCR: NOT DETECTED

## 2017-10-02 MED ORDER — SODIUM CHLORIDE 0.9 % IV BOLUS
1000.0000 mL | Freq: Once | INTRAVENOUS | Status: AC
  Administered 2017-10-02: 1000 mL via INTRAVENOUS

## 2017-10-02 MED ORDER — GI COCKTAIL ~~LOC~~
30.0000 mL | Freq: Once | ORAL | Status: AC
  Administered 2017-10-02: 30 mL via ORAL
  Filled 2017-10-02: qty 30

## 2017-10-02 NOTE — Discharge Instructions (Addendum)
Stop Losartan.  Call your doctor tomorrow to see what medication they want you to switch to.

## 2017-10-02 NOTE — ED Provider Notes (Signed)
[facility]-EMERGENCY DEPT Provider Note   CSN: [id number] Arrival date & time: 10/02/17  0808     History   Chief Complaint Chief Complaint  Patient presents with  . Medication Reaction    HPI Douglas Riley is a 67 y.o. male.  Pt presents to the ED today with a possible medication reaction.  Pt started taking Losartan 25 mg 7 days ago b/c he had protein in his urine.  He has developed uri sx, cough, and sore throat.  Pt feels weak.  He has never had a drug reaction in the past.  He denies f/c.      Past Medical History:  Diagnosis Date  . Diabetes mellitus without complication (HCC)   . Hypertension   . Thyroid disease     There are no active problems to display for this patient.   Past Surgical History:  Procedure Laterality Date  . FRACTURE SURGERY          Home Medications    Prior to Admission medications   Medication Sig Start Date End Date Taking? Authorizing Provider  amLODipine-benazepril (LOTREL) 10-20 MG capsule Take 1 tablet by mouth daily. 07/13/17  Yes [provider]  aspirin EC 81 MG tablet Take 81 mg by mouth daily.   Yes [provider]  celecoxib (CELEBREX) 200 MG capsule Take 200 mg by mouth daily as needed for mild pain.  09/18/17  Yes [provider]  CRESTOR 20 MG tablet Take 20 mg by mouth daily. 07/18/14  Yes [provider]  Cyanocobalamin (VITAMIN B-12 PO) Take 1 tablet by mouth daily.   Yes [provider]  ketorolac (TORADOL) 10 MG tablet Take 1 tablet (10 mg total) by mouth every 6 (six) hours as needed for severe pain. Patient taking differently: Take 10 mg by mouth every 6 (six) hours as needed for severe pain.  09/01/14  Yes Danelle Berry, PA-C  metFORMIN (GLUCOPHAGE-XR) 500 MG 24 hr tablet Take 1,000 mg by mouth 2 (two) times daily. 08/26/17  Yes [provider]  SYNTHROID 137 MCG tablet Take 137 mcg by mouth See admin instructions.  08/05/14  Yes [provider]  TRULICITY 1.5 MG/0.5ML SOPN Inject 1.5 mg into the skin every Wednesday. 09/12/17  Yes [provider]  VIAGRA 100 MG tablet Take 50-100 mg by mouth daily as needed for erectile dysfunction.  07/19/14  Yes [provider]  VITAMIN D, CHOLECALCIFEROL, PO Take 1 tablet by mouth daily.   Yes [provider]  dicyclomine (BENTYL) 20 MG tablet Take 1 tablet (20 mg total) by mouth 2 (two) times daily. Patient not taking: Reported on 10/02/2017 09/01/14   Danelle Berry, PA-C  ondansetron (ZOFRAN ODT) 4 MG disintegrating tablet Take 1 tablet (4 mg total) by mouth every 8 (eight) hours as needed for nausea or vomiting. Patient not taking: Reported on 10/02/2017 09/01/14   Danelle Berry, PA-C    Family History No family history on file.  Social History Social History   Tobacco Use  . Smoking status: Never Smoker  . Smokeless tobacco: Never Used  Substance Use Topics  . Alcohol use: Yes  . Drug use: Not on file     Allergies   Patient has no known allergies.   Review of Systems Review of Systems  Constitutional: Positive for fatigue.  HENT: Positive for congestion, sore throat and trouble swallowing.   Respiratory: Positive for chest tightness.   All other systems reviewed and are negative.  Physical Exam Updated Vital Signs BP 120/84   Pulse 80   Temp 97.8 F (36.6 C) (Oral)   Resp 18   SpO2 91%   Physical Exam  Constitutional: He is oriented to person, place, and time. He appears well-developed and well-nourished.  HENT:  Head: Normocephalic and atraumatic.  Right Ear: External ear normal.  Left Ear: External ear normal.  Nose: Nose normal.  Mouth/Throat: Oropharynx is clear and moist.  Pt is hoarse  Eyes: Pupils are equal, round, and reactive to light. Conjunctivae and EOM are normal.  Neck: Normal range of motion. Neck supple.  Cardiovascular: Normal rate, regular rhythm, normal heart sounds and intact distal pulses.    Pulmonary/Chest: Effort normal and breath sounds normal.  Abdominal: Soft. Bowel sounds are normal.  Musculoskeletal: Normal range of motion.  Neurological: He is alert and oriented to person, place, and time.  Skin: Skin is warm. Capillary refill takes less than 2 seconds.  Psychiatric: He has a normal mood and affect. His behavior is normal. Judgment and thought content normal.  Nursing note and vitals reviewed.    ED Treatments / Results  Labs (all labs ordered are listed, but only abnormal results are displayed) Labs Reviewed  COMPREHENSIVE METABOLIC PANEL - Abnormal; Notable for the following components:      Result Value   Glucose, Bld 130 (*)    Total Bilirubin 1.4 (*)    All other components within normal limits  CBC WITH DIFFERENTIAL/PLATELET - Abnormal; Notable for the following components:   WBC 12.1 (*)    Hemoglobin 17.3 (*)    MCHC 36.1 (*)    Neutro Abs 8.8 (*)    All other components within normal limits  GROUP A STREP BY PCR  URINALYSIS, ROUTINE W REFLEX MICROSCOPIC  TROPONIN I    EKG EKG Interpretation  Date/Time:  Sunday October 02 2017 09:30:41 EDT Ventricular Rate:  89 PR Interval:    QRS Duration: 106 QT Interval:  366 QTC Calculation: 446 R Axis:   78 Text Interpretation:  Sinus rhythm Ventricular premature complex Low voltage, precordial leads Baseline wander in lead(s) II III aVF V5 pvcs are new.  otherwise, no acute change since last tracing Confirmed by Jacalyn LefevreHaviland, Deshanae Lindo [phone] on 10/02/2017 9:32:51 AM   Radiology Dg Chest 2 View  Result Date: 10/02/2017 CLINICAL DATA:  Hoarseness.  Cough. EXAM: CHEST - 2 VIEW COMPARISON:  April 07, 2009 FINDINGS: The heart, hila, and mediastinum are normal. No pneumothorax. Elevation of the right hemidiaphragm with adjacent atelectasis again identified. No suspicious nodules, masses, or focal infiltrates. No other acute abnormalities. IMPRESSION: No active cardiopulmonary disease. Electronically Signed   By:  Gerome Samavid  Williams III M.D   On: 10/02/2017 11:32    Procedures Procedures (including critical care time)  Medications Ordered in ED Medications  sodium chloride 0.9 % bolus 1,000 mL (1,000 mLs Intravenous New Bag/Given 10/02/17 0929)  gi cocktail (Maalox,Lidocaine,Donnatal) (30 mLs Oral Given 10/02/17 [id number])     Initial Impression / Assessment and Plan / ED Course  I have reviewed the triage vital signs and the nursing notes.  Pertinent labs & imaging results that were available during my care of the patient were reviewed by me and considered in my medical decision making (see chart for details).   Pt is feeling a little better.  Losartan lists as a side effect cold and flu sx, so this could be a side effect of the medication or it could be a uri.  Pt instructed to  stop losartan and to call his doctor tomorrow to see which medication that he needs to take instead.  Return if worse.   Final Clinical Impressions(s) / ED Diagnoses   Final diagnoses:  Adverse effect of drug, initial encounter    ED Discharge Orders    None       Jacalyn Lefevre, MD 10/02/17 1143

## 2017-10-02 NOTE — ED Triage Notes (Signed)
Patient here from home with complaints of medication reaction. Reports that he has been taking Losartan 25mg  for 7 days. Reports unusual hoarseness and feeling like he has to cough.

## 2017-10-06 DIAGNOSIS — J029 Acute pharyngitis, unspecified: Secondary | ICD-10-CM | POA: Diagnosis not present

## 2017-10-06 DIAGNOSIS — J209 Acute bronchitis, unspecified: Secondary | ICD-10-CM | POA: Diagnosis not present

## 2017-10-27 DIAGNOSIS — E119 Type 2 diabetes mellitus without complications: Secondary | ICD-10-CM | POA: Diagnosis not present

## 2017-10-27 DIAGNOSIS — E559 Vitamin D deficiency, unspecified: Secondary | ICD-10-CM | POA: Diagnosis not present

## 2017-11-03 DIAGNOSIS — Z0000 Encounter for general adult medical examination without abnormal findings: Secondary | ICD-10-CM | POA: Diagnosis not present

## 2017-11-03 DIAGNOSIS — I10 Essential (primary) hypertension: Secondary | ICD-10-CM | POA: Diagnosis not present

## 2017-11-03 DIAGNOSIS — E119 Type 2 diabetes mellitus without complications: Secondary | ICD-10-CM | POA: Diagnosis not present

## 2017-11-03 DIAGNOSIS — M25422 Effusion, left elbow: Secondary | ICD-10-CM | POA: Diagnosis not present

## 2017-11-10 DIAGNOSIS — M25522 Pain in left elbow: Secondary | ICD-10-CM | POA: Diagnosis not present

## 2017-11-10 DIAGNOSIS — M7022 Olecranon bursitis, left elbow: Secondary | ICD-10-CM | POA: Diagnosis not present

## 2017-11-10 DIAGNOSIS — M25422 Effusion, left elbow: Secondary | ICD-10-CM | POA: Diagnosis not present

## 2017-12-13 DIAGNOSIS — E559 Vitamin D deficiency, unspecified: Secondary | ICD-10-CM | POA: Diagnosis not present

## 2017-12-13 DIAGNOSIS — E039 Hypothyroidism, unspecified: Secondary | ICD-10-CM | POA: Diagnosis not present

## 2017-12-13 DIAGNOSIS — Z6827 Body mass index (BMI) 27.0-27.9, adult: Secondary | ICD-10-CM | POA: Diagnosis not present

## 2017-12-13 DIAGNOSIS — R809 Proteinuria, unspecified: Secondary | ICD-10-CM | POA: Diagnosis not present

## 2017-12-13 DIAGNOSIS — I10 Essential (primary) hypertension: Secondary | ICD-10-CM | POA: Diagnosis not present

## 2017-12-13 DIAGNOSIS — Z23 Encounter for immunization: Secondary | ICD-10-CM | POA: Diagnosis not present

## 2017-12-13 DIAGNOSIS — E785 Hyperlipidemia, unspecified: Secondary | ICD-10-CM | POA: Diagnosis not present

## 2017-12-13 DIAGNOSIS — E119 Type 2 diabetes mellitus without complications: Secondary | ICD-10-CM | POA: Diagnosis not present

## 2018-01-26 DIAGNOSIS — Z961 Presence of intraocular lens: Secondary | ICD-10-CM | POA: Diagnosis not present

## 2018-01-26 DIAGNOSIS — H35033 Hypertensive retinopathy, bilateral: Secondary | ICD-10-CM | POA: Diagnosis not present

## 2018-01-26 DIAGNOSIS — E119 Type 2 diabetes mellitus without complications: Secondary | ICD-10-CM | POA: Diagnosis not present

## 2018-01-26 DIAGNOSIS — H26493 Other secondary cataract, bilateral: Secondary | ICD-10-CM | POA: Diagnosis not present

## 2018-03-06 DIAGNOSIS — I951 Orthostatic hypotension: Secondary | ICD-10-CM | POA: Diagnosis not present

## 2018-03-06 DIAGNOSIS — I10 Essential (primary) hypertension: Secondary | ICD-10-CM | POA: Diagnosis not present

## 2018-04-03 DIAGNOSIS — I951 Orthostatic hypotension: Secondary | ICD-10-CM | POA: Diagnosis not present

## 2018-04-03 DIAGNOSIS — I10 Essential (primary) hypertension: Secondary | ICD-10-CM | POA: Diagnosis not present

## 2018-06-06 DIAGNOSIS — E039 Hypothyroidism, unspecified: Secondary | ICD-10-CM | POA: Diagnosis not present

## 2018-06-06 DIAGNOSIS — E559 Vitamin D deficiency, unspecified: Secondary | ICD-10-CM | POA: Diagnosis not present

## 2018-06-06 DIAGNOSIS — E119 Type 2 diabetes mellitus without complications: Secondary | ICD-10-CM | POA: Diagnosis not present

## 2018-06-13 DIAGNOSIS — R809 Proteinuria, unspecified: Secondary | ICD-10-CM | POA: Diagnosis not present

## 2018-06-13 DIAGNOSIS — E559 Vitamin D deficiency, unspecified: Secondary | ICD-10-CM | POA: Diagnosis not present

## 2018-06-13 DIAGNOSIS — E785 Hyperlipidemia, unspecified: Secondary | ICD-10-CM | POA: Diagnosis not present

## 2018-06-13 DIAGNOSIS — Z6828 Body mass index (BMI) 28.0-28.9, adult: Secondary | ICD-10-CM | POA: Diagnosis not present

## 2018-06-13 DIAGNOSIS — I10 Essential (primary) hypertension: Secondary | ICD-10-CM | POA: Diagnosis not present

## 2018-06-13 DIAGNOSIS — E119 Type 2 diabetes mellitus without complications: Secondary | ICD-10-CM | POA: Diagnosis not present

## 2018-06-13 DIAGNOSIS — E039 Hypothyroidism, unspecified: Secondary | ICD-10-CM | POA: Diagnosis not present

## 2018-10-03 DIAGNOSIS — E119 Type 2 diabetes mellitus without complications: Secondary | ICD-10-CM | POA: Diagnosis not present

## 2018-10-03 DIAGNOSIS — Z125 Encounter for screening for malignant neoplasm of prostate: Secondary | ICD-10-CM | POA: Diagnosis not present

## 2018-10-03 DIAGNOSIS — I10 Essential (primary) hypertension: Secondary | ICD-10-CM | POA: Diagnosis not present

## 2018-10-03 DIAGNOSIS — E039 Hypothyroidism, unspecified: Secondary | ICD-10-CM | POA: Diagnosis not present

## 2018-10-03 DIAGNOSIS — Z0000 Encounter for general adult medical examination without abnormal findings: Secondary | ICD-10-CM | POA: Diagnosis not present

## 2018-10-03 DIAGNOSIS — E785 Hyperlipidemia, unspecified: Secondary | ICD-10-CM | POA: Diagnosis not present

## 2018-10-10 DIAGNOSIS — Z0000 Encounter for general adult medical examination without abnormal findings: Secondary | ICD-10-CM | POA: Diagnosis not present

## 2018-10-10 DIAGNOSIS — E119 Type 2 diabetes mellitus without complications: Secondary | ICD-10-CM | POA: Diagnosis not present

## 2018-10-10 DIAGNOSIS — E039 Hypothyroidism, unspecified: Secondary | ICD-10-CM | POA: Diagnosis not present

## 2018-10-10 DIAGNOSIS — Z125 Encounter for screening for malignant neoplasm of prostate: Secondary | ICD-10-CM | POA: Diagnosis not present

## 2018-10-10 DIAGNOSIS — J449 Chronic obstructive pulmonary disease, unspecified: Secondary | ICD-10-CM | POA: Diagnosis not present

## 2018-10-10 DIAGNOSIS — E785 Hyperlipidemia, unspecified: Secondary | ICD-10-CM | POA: Diagnosis not present

## 2018-10-10 DIAGNOSIS — I10 Essential (primary) hypertension: Secondary | ICD-10-CM | POA: Diagnosis not present

## 2018-12-07 DIAGNOSIS — E119 Type 2 diabetes mellitus without complications: Secondary | ICD-10-CM | POA: Diagnosis not present

## 2018-12-07 DIAGNOSIS — E039 Hypothyroidism, unspecified: Secondary | ICD-10-CM | POA: Diagnosis not present

## 2018-12-14 DIAGNOSIS — E785 Hyperlipidemia, unspecified: Secondary | ICD-10-CM | POA: Diagnosis not present

## 2018-12-14 DIAGNOSIS — E039 Hypothyroidism, unspecified: Secondary | ICD-10-CM | POA: Diagnosis not present

## 2018-12-14 DIAGNOSIS — E119 Type 2 diabetes mellitus without complications: Secondary | ICD-10-CM | POA: Diagnosis not present

## 2018-12-14 DIAGNOSIS — Z23 Encounter for immunization: Secondary | ICD-10-CM | POA: Diagnosis not present

## 2018-12-14 DIAGNOSIS — E559 Vitamin D deficiency, unspecified: Secondary | ICD-10-CM | POA: Diagnosis not present

## 2018-12-14 DIAGNOSIS — I10 Essential (primary) hypertension: Secondary | ICD-10-CM | POA: Diagnosis not present

## 2018-12-14 DIAGNOSIS — Z6828 Body mass index (BMI) 28.0-28.9, adult: Secondary | ICD-10-CM | POA: Diagnosis not present

## 2018-12-14 DIAGNOSIS — R809 Proteinuria, unspecified: Secondary | ICD-10-CM | POA: Diagnosis not present

## 2019-02-12 DIAGNOSIS — E119 Type 2 diabetes mellitus without complications: Secondary | ICD-10-CM | POA: Diagnosis not present

## 2019-02-12 DIAGNOSIS — H35033 Hypertensive retinopathy, bilateral: Secondary | ICD-10-CM | POA: Diagnosis not present

## 2019-02-12 DIAGNOSIS — Z961 Presence of intraocular lens: Secondary | ICD-10-CM | POA: Diagnosis not present

## 2019-02-12 DIAGNOSIS — H04123 Dry eye syndrome of bilateral lacrimal glands: Secondary | ICD-10-CM | POA: Diagnosis not present

## 2019-05-31 IMAGING — CR DG CHEST 2V
2 series · 2 of 2 positions shown · non-contrast
Comparison: April 07, 2009

CLINICAL DATA: Hoarseness.  Cough.

EXAM:
CHEST - 2 VIEW

[w chest pa]
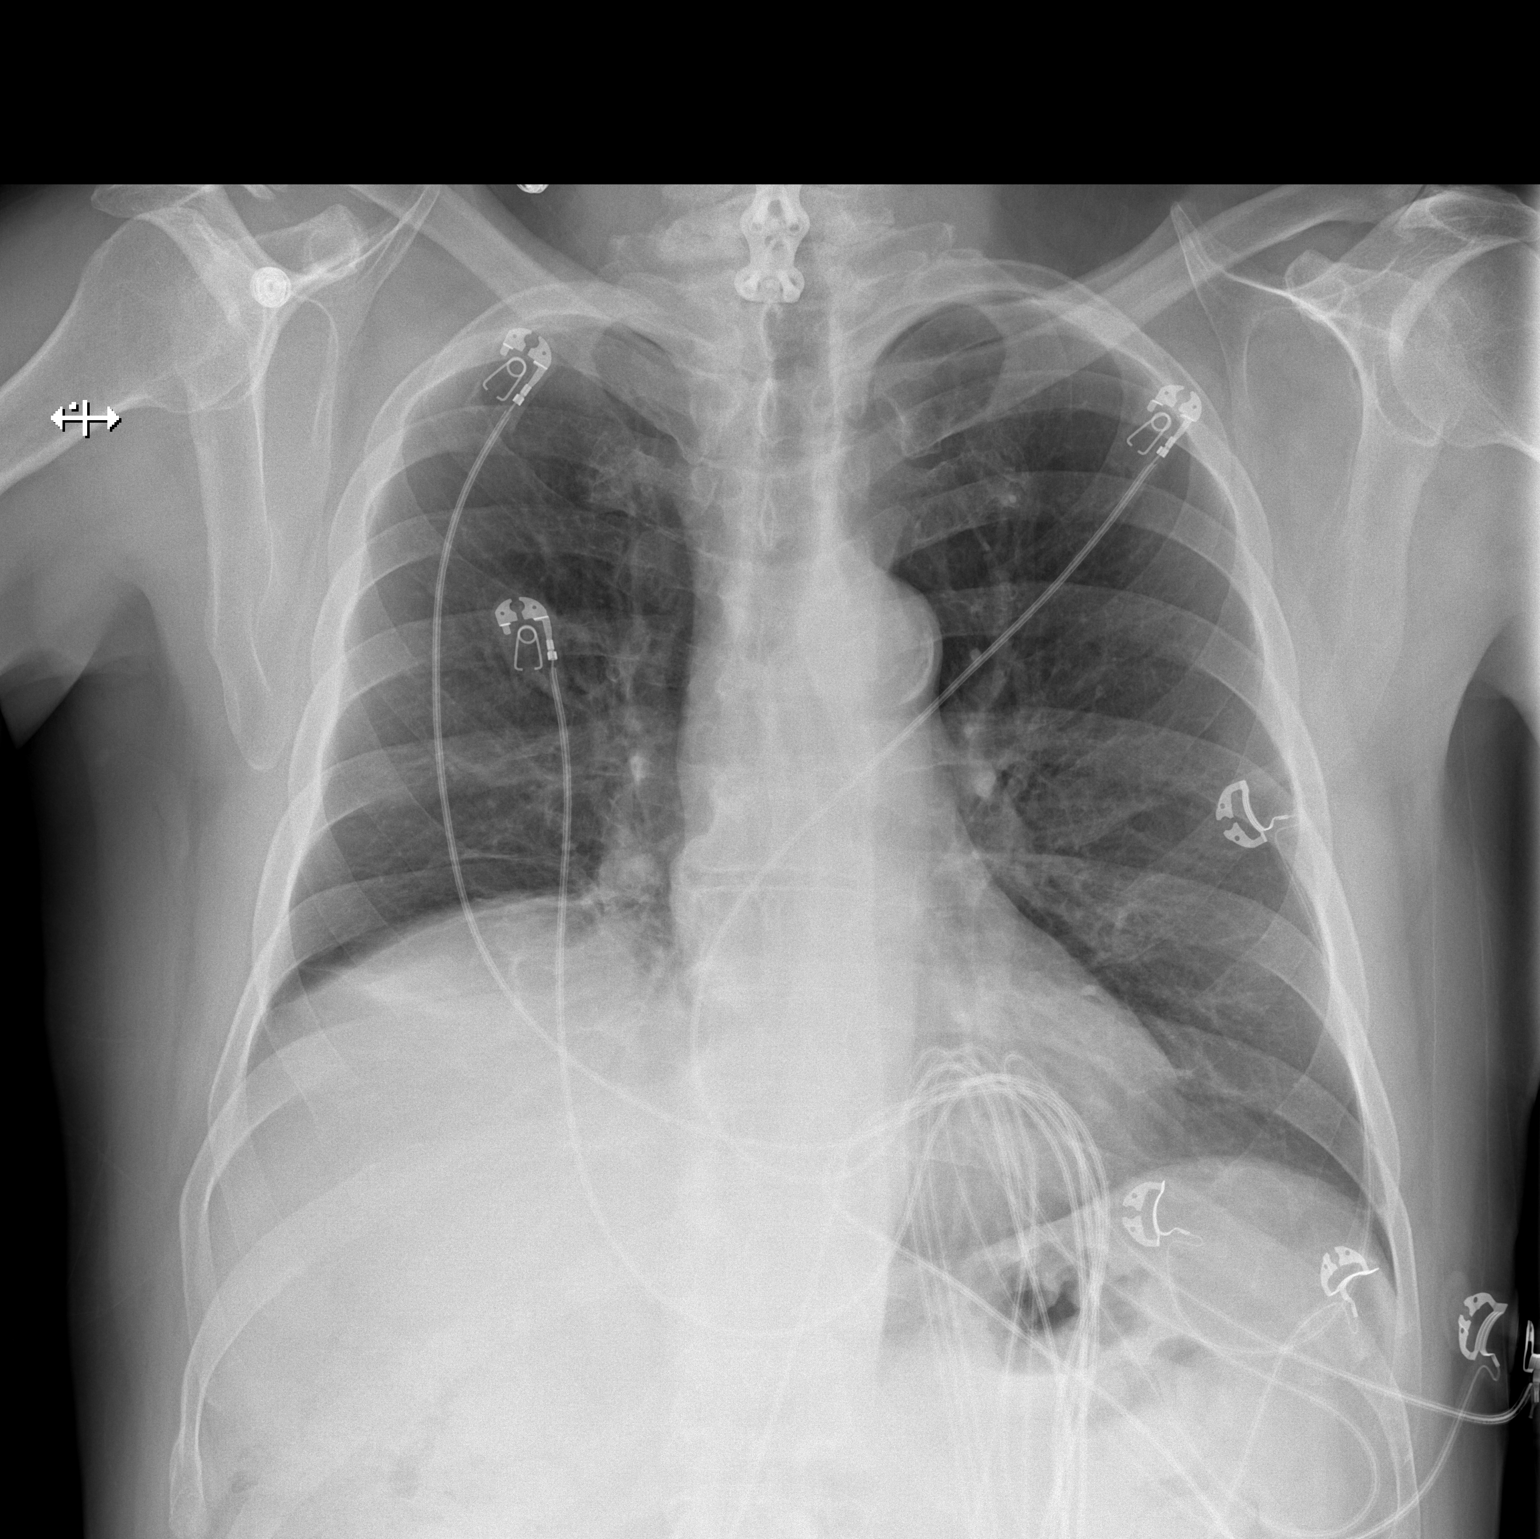

[w chest lat]
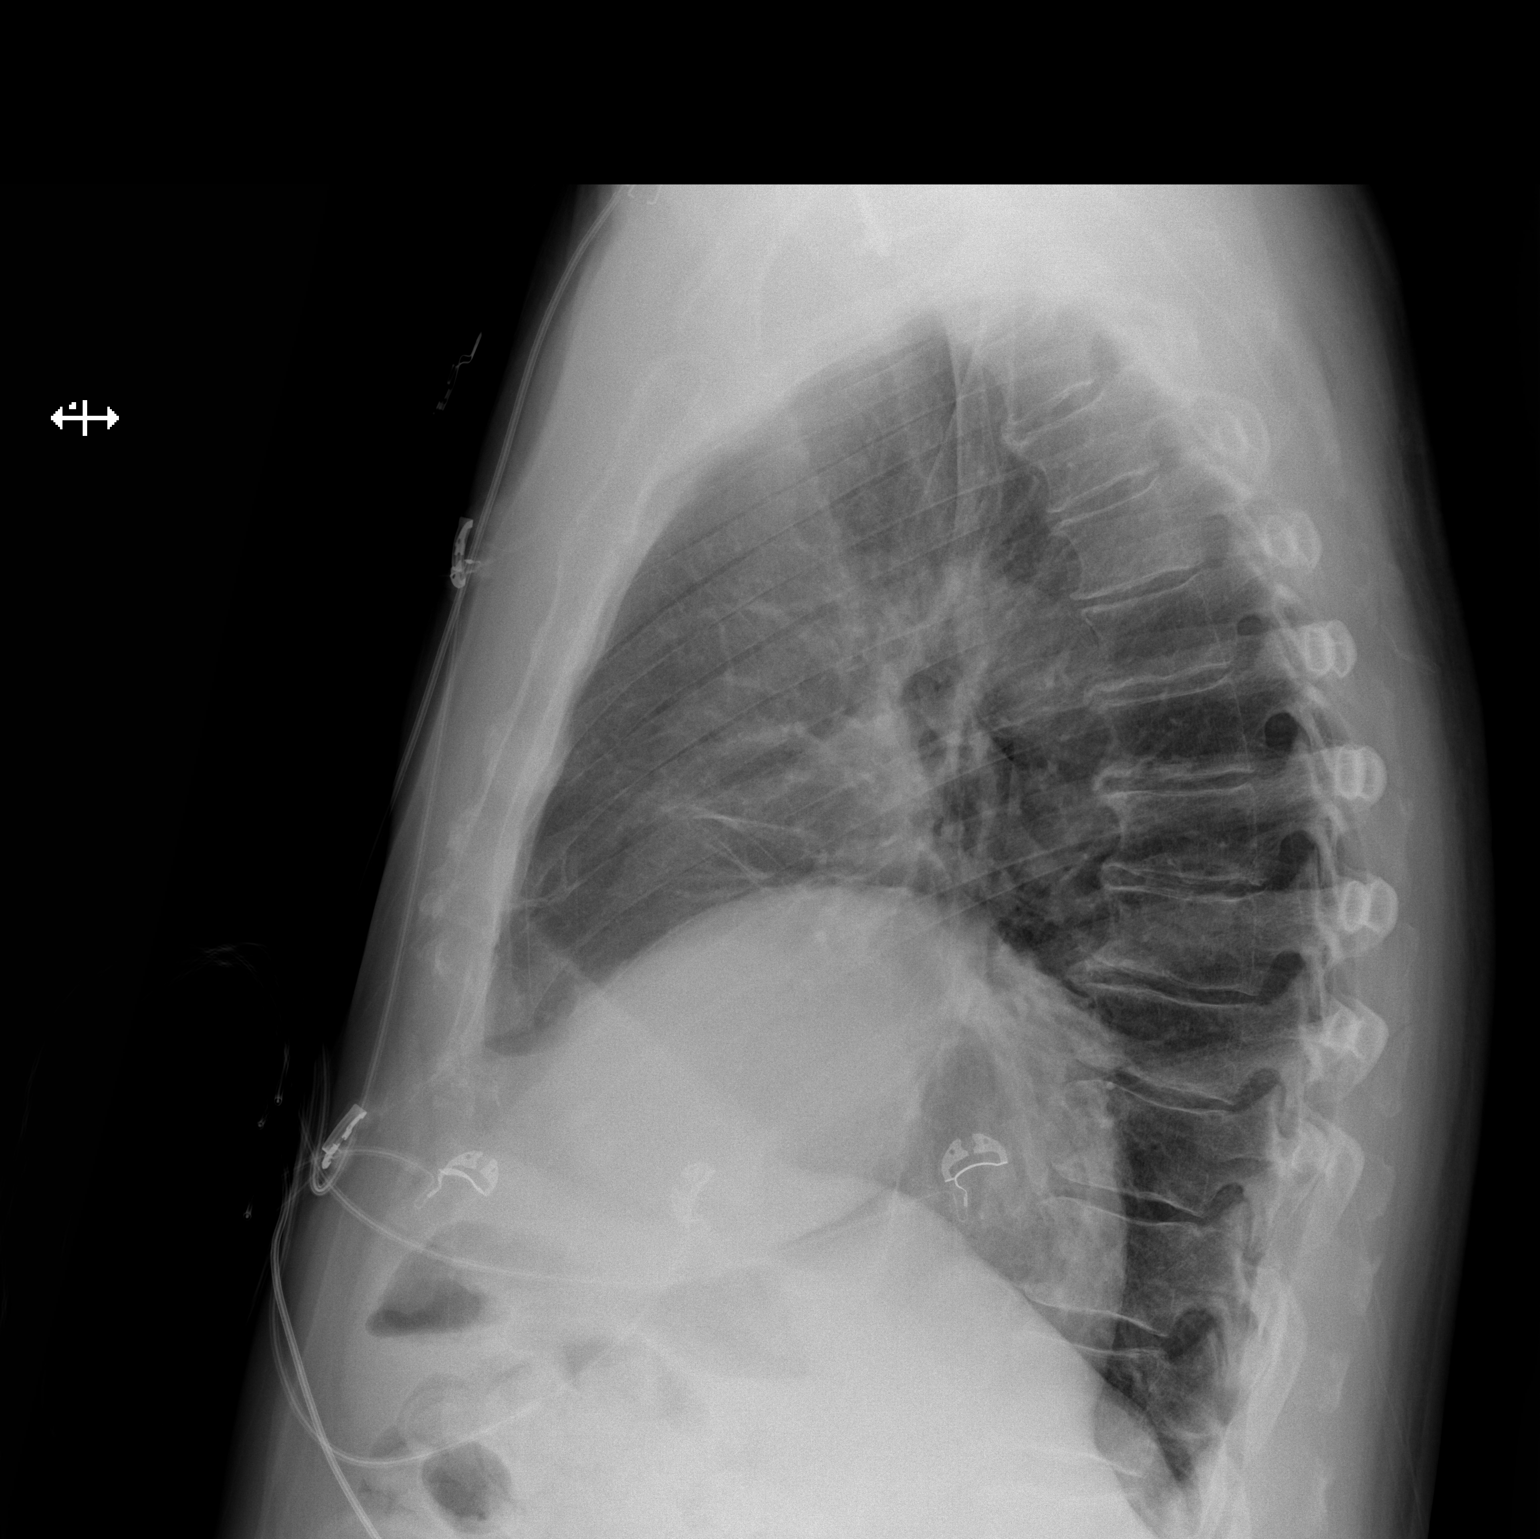

[2 of 2 positions shown; findings below may reference images not displayed]

FINDINGS: The heart, hila, and mediastinum are normal. No pneumothorax.
Elevation of the right hemidiaphragm with adjacent atelectasis again
identified. No suspicious nodules, masses, or focal infiltrates. No
other acute abnormalities.
IMPRESSION: No active cardiopulmonary disease.

## 2019-06-07 DIAGNOSIS — E785 Hyperlipidemia, unspecified: Secondary | ICD-10-CM | POA: Diagnosis not present

## 2019-06-07 DIAGNOSIS — E538 Deficiency of other specified B group vitamins: Secondary | ICD-10-CM | POA: Diagnosis not present

## 2019-06-07 DIAGNOSIS — E559 Vitamin D deficiency, unspecified: Secondary | ICD-10-CM | POA: Diagnosis not present

## 2019-06-07 DIAGNOSIS — E039 Hypothyroidism, unspecified: Secondary | ICD-10-CM | POA: Diagnosis not present

## 2019-06-07 DIAGNOSIS — E119 Type 2 diabetes mellitus without complications: Secondary | ICD-10-CM | POA: Diagnosis not present

## 2019-06-12 DIAGNOSIS — E785 Hyperlipidemia, unspecified: Secondary | ICD-10-CM | POA: Diagnosis not present

## 2019-06-12 DIAGNOSIS — E039 Hypothyroidism, unspecified: Secondary | ICD-10-CM | POA: Diagnosis not present

## 2019-06-12 DIAGNOSIS — R809 Proteinuria, unspecified: Secondary | ICD-10-CM | POA: Diagnosis not present

## 2019-06-12 DIAGNOSIS — E559 Vitamin D deficiency, unspecified: Secondary | ICD-10-CM | POA: Diagnosis not present

## 2019-06-12 DIAGNOSIS — I10 Essential (primary) hypertension: Secondary | ICD-10-CM | POA: Diagnosis not present

## 2019-06-12 DIAGNOSIS — Z6828 Body mass index (BMI) 28.0-28.9, adult: Secondary | ICD-10-CM | POA: Diagnosis not present

## 2019-06-12 DIAGNOSIS — E119 Type 2 diabetes mellitus without complications: Secondary | ICD-10-CM | POA: Diagnosis not present

## 2019-07-10 DIAGNOSIS — H6120 Impacted cerumen, unspecified ear: Secondary | ICD-10-CM | POA: Diagnosis not present

## 2019-07-10 DIAGNOSIS — R000 Tachycardia, unspecified: Secondary | ICD-10-CM | POA: Diagnosis not present

## 2019-07-10 DIAGNOSIS — J449 Chronic obstructive pulmonary disease, unspecified: Secondary | ICD-10-CM | POA: Diagnosis not present

## 2019-07-10 DIAGNOSIS — R42 Dizziness and giddiness: Secondary | ICD-10-CM | POA: Diagnosis not present

## 2019-07-16 DIAGNOSIS — Z23 Encounter for immunization: Secondary | ICD-10-CM | POA: Diagnosis not present

## 2019-07-26 DIAGNOSIS — R42 Dizziness and giddiness: Secondary | ICD-10-CM | POA: Diagnosis not present

## 2019-07-26 DIAGNOSIS — J449 Chronic obstructive pulmonary disease, unspecified: Secondary | ICD-10-CM | POA: Diagnosis not present

## 2019-12-13 DIAGNOSIS — E039 Hypothyroidism, unspecified: Secondary | ICD-10-CM | POA: Diagnosis not present

## 2019-12-13 DIAGNOSIS — E119 Type 2 diabetes mellitus without complications: Secondary | ICD-10-CM | POA: Diagnosis not present

## 2019-12-13 DIAGNOSIS — E559 Vitamin D deficiency, unspecified: Secondary | ICD-10-CM | POA: Diagnosis not present

## 2019-12-19 DIAGNOSIS — M25522 Pain in left elbow: Secondary | ICD-10-CM | POA: Diagnosis not present

## 2019-12-19 DIAGNOSIS — M25532 Pain in left wrist: Secondary | ICD-10-CM | POA: Diagnosis not present

## 2019-12-19 DIAGNOSIS — M79645 Pain in left finger(s): Secondary | ICD-10-CM | POA: Diagnosis not present

## 2019-12-20 DIAGNOSIS — I10 Essential (primary) hypertension: Secondary | ICD-10-CM | POA: Diagnosis not present

## 2019-12-20 DIAGNOSIS — E119 Type 2 diabetes mellitus without complications: Secondary | ICD-10-CM | POA: Diagnosis not present

## 2019-12-20 DIAGNOSIS — E785 Hyperlipidemia, unspecified: Secondary | ICD-10-CM | POA: Diagnosis not present

## 2019-12-20 DIAGNOSIS — E039 Hypothyroidism, unspecified: Secondary | ICD-10-CM | POA: Diagnosis not present

## 2019-12-20 DIAGNOSIS — E559 Vitamin D deficiency, unspecified: Secondary | ICD-10-CM | POA: Diagnosis not present

## 2019-12-20 DIAGNOSIS — R809 Proteinuria, unspecified: Secondary | ICD-10-CM | POA: Diagnosis not present

## 2019-12-20 DIAGNOSIS — Z6828 Body mass index (BMI) 28.0-28.9, adult: Secondary | ICD-10-CM | POA: Diagnosis not present

## 2020-03-14 DIAGNOSIS — H04213 Epiphora due to excess lacrimation, bilateral lacrimal glands: Secondary | ICD-10-CM | POA: Diagnosis not present

## 2020-03-14 DIAGNOSIS — E119 Type 2 diabetes mellitus without complications: Secondary | ICD-10-CM | POA: Diagnosis not present

## 2020-03-14 DIAGNOSIS — H35033 Hypertensive retinopathy, bilateral: Secondary | ICD-10-CM | POA: Diagnosis not present

## 2020-03-14 DIAGNOSIS — H53143 Visual discomfort, bilateral: Secondary | ICD-10-CM | POA: Diagnosis not present

## 2020-04-09 DIAGNOSIS — E559 Vitamin D deficiency, unspecified: Secondary | ICD-10-CM | POA: Diagnosis not present

## 2020-04-09 DIAGNOSIS — E119 Type 2 diabetes mellitus without complications: Secondary | ICD-10-CM | POA: Diagnosis not present

## 2020-04-09 DIAGNOSIS — E039 Hypothyroidism, unspecified: Secondary | ICD-10-CM | POA: Diagnosis not present

## 2020-04-09 DIAGNOSIS — E538 Deficiency of other specified B group vitamins: Secondary | ICD-10-CM | POA: Diagnosis not present

## 2020-04-15 DIAGNOSIS — M1990 Unspecified osteoarthritis, unspecified site: Secondary | ICD-10-CM | POA: Diagnosis not present

## 2020-04-15 DIAGNOSIS — E119 Type 2 diabetes mellitus without complications: Secondary | ICD-10-CM | POA: Diagnosis not present

## 2020-04-15 DIAGNOSIS — G4700 Insomnia, unspecified: Secondary | ICD-10-CM | POA: Diagnosis not present

## 2020-04-15 DIAGNOSIS — J449 Chronic obstructive pulmonary disease, unspecified: Secondary | ICD-10-CM | POA: Diagnosis not present

## 2020-04-15 DIAGNOSIS — M5030 Other cervical disc degeneration, unspecified cervical region: Secondary | ICD-10-CM | POA: Diagnosis not present

## 2020-04-15 DIAGNOSIS — E559 Vitamin D deficiency, unspecified: Secondary | ICD-10-CM | POA: Diagnosis not present

## 2020-04-15 DIAGNOSIS — I10 Essential (primary) hypertension: Secondary | ICD-10-CM | POA: Diagnosis not present

## 2020-04-15 DIAGNOSIS — E785 Hyperlipidemia, unspecified: Secondary | ICD-10-CM | POA: Diagnosis not present

## 2020-04-28 DIAGNOSIS — E039 Hypothyroidism, unspecified: Secondary | ICD-10-CM | POA: Diagnosis not present

## 2020-04-28 DIAGNOSIS — Z6828 Body mass index (BMI) 28.0-28.9, adult: Secondary | ICD-10-CM | POA: Diagnosis not present

## 2020-04-28 DIAGNOSIS — E119 Type 2 diabetes mellitus without complications: Secondary | ICD-10-CM | POA: Diagnosis not present

## 2020-04-28 DIAGNOSIS — E785 Hyperlipidemia, unspecified: Secondary | ICD-10-CM | POA: Diagnosis not present

## 2020-04-28 DIAGNOSIS — R809 Proteinuria, unspecified: Secondary | ICD-10-CM | POA: Diagnosis not present

## 2020-04-28 DIAGNOSIS — I10 Essential (primary) hypertension: Secondary | ICD-10-CM | POA: Diagnosis not present

## 2020-04-28 DIAGNOSIS — E559 Vitamin D deficiency, unspecified: Secondary | ICD-10-CM | POA: Diagnosis not present

## 2020-07-22 DIAGNOSIS — E119 Type 2 diabetes mellitus without complications: Secondary | ICD-10-CM | POA: Diagnosis not present

## 2020-07-22 DIAGNOSIS — E039 Hypothyroidism, unspecified: Secondary | ICD-10-CM | POA: Diagnosis not present

## 2020-07-29 DIAGNOSIS — R809 Proteinuria, unspecified: Secondary | ICD-10-CM | POA: Diagnosis not present

## 2020-07-29 DIAGNOSIS — E1169 Type 2 diabetes mellitus with other specified complication: Secondary | ICD-10-CM | POA: Diagnosis not present

## 2020-07-29 DIAGNOSIS — I10 Essential (primary) hypertension: Secondary | ICD-10-CM | POA: Diagnosis not present

## 2020-07-29 DIAGNOSIS — E039 Hypothyroidism, unspecified: Secondary | ICD-10-CM | POA: Diagnosis not present

## 2020-07-29 DIAGNOSIS — E559 Vitamin D deficiency, unspecified: Secondary | ICD-10-CM | POA: Diagnosis not present

## 2020-07-29 DIAGNOSIS — E785 Hyperlipidemia, unspecified: Secondary | ICD-10-CM | POA: Diagnosis not present

## 2020-07-29 DIAGNOSIS — Z6828 Body mass index (BMI) 28.0-28.9, adult: Secondary | ICD-10-CM | POA: Diagnosis not present

## 2020-07-29 DIAGNOSIS — E119 Type 2 diabetes mellitus without complications: Secondary | ICD-10-CM | POA: Diagnosis not present

## 2020-10-28 DIAGNOSIS — E559 Vitamin D deficiency, unspecified: Secondary | ICD-10-CM | POA: Diagnosis not present

## 2020-10-28 DIAGNOSIS — E119 Type 2 diabetes mellitus without complications: Secondary | ICD-10-CM | POA: Diagnosis not present

## 2020-10-29 DIAGNOSIS — I10 Essential (primary) hypertension: Secondary | ICD-10-CM | POA: Diagnosis not present

## 2020-11-03 DIAGNOSIS — E785 Hyperlipidemia, unspecified: Secondary | ICD-10-CM | POA: Diagnosis not present

## 2020-11-03 DIAGNOSIS — R809 Proteinuria, unspecified: Secondary | ICD-10-CM | POA: Diagnosis not present

## 2020-11-03 DIAGNOSIS — Z6828 Body mass index (BMI) 28.0-28.9, adult: Secondary | ICD-10-CM | POA: Diagnosis not present

## 2020-11-03 DIAGNOSIS — E559 Vitamin D deficiency, unspecified: Secondary | ICD-10-CM | POA: Diagnosis not present

## 2020-11-03 DIAGNOSIS — I10 Essential (primary) hypertension: Secondary | ICD-10-CM | POA: Diagnosis not present

## 2020-11-03 DIAGNOSIS — E039 Hypothyroidism, unspecified: Secondary | ICD-10-CM | POA: Diagnosis not present

## 2020-11-03 DIAGNOSIS — E119 Type 2 diabetes mellitus without complications: Secondary | ICD-10-CM | POA: Diagnosis not present

## 2020-11-03 DIAGNOSIS — E1169 Type 2 diabetes mellitus with other specified complication: Secondary | ICD-10-CM | POA: Diagnosis not present

## 2020-11-05 DIAGNOSIS — R351 Nocturia: Secondary | ICD-10-CM | POA: Diagnosis not present

## 2020-11-05 DIAGNOSIS — E559 Vitamin D deficiency, unspecified: Secondary | ICD-10-CM | POA: Diagnosis not present

## 2020-11-05 DIAGNOSIS — E039 Hypothyroidism, unspecified: Secondary | ICD-10-CM | POA: Diagnosis not present

## 2020-11-05 DIAGNOSIS — Z0000 Encounter for general adult medical examination without abnormal findings: Secondary | ICD-10-CM | POA: Diagnosis not present

## 2020-11-05 DIAGNOSIS — E785 Hyperlipidemia, unspecified: Secondary | ICD-10-CM | POA: Diagnosis not present

## 2020-11-05 DIAGNOSIS — I10 Essential (primary) hypertension: Secondary | ICD-10-CM | POA: Diagnosis not present

## 2020-11-05 DIAGNOSIS — Z6828 Body mass index (BMI) 28.0-28.9, adult: Secondary | ICD-10-CM | POA: Diagnosis not present

## 2020-11-05 DIAGNOSIS — E1169 Type 2 diabetes mellitus with other specified complication: Secondary | ICD-10-CM | POA: Diagnosis not present

## 2020-11-05 DIAGNOSIS — Z125 Encounter for screening for malignant neoplasm of prostate: Secondary | ICD-10-CM | POA: Diagnosis not present

## 2020-12-31 DIAGNOSIS — H903 Sensorineural hearing loss, bilateral: Secondary | ICD-10-CM | POA: Diagnosis not present

## 2020-12-31 DIAGNOSIS — Z570 Occupational exposure to noise: Secondary | ICD-10-CM | POA: Diagnosis not present

## 2020-12-31 DIAGNOSIS — E119 Type 2 diabetes mellitus without complications: Secondary | ICD-10-CM | POA: Diagnosis not present

## 2020-12-31 DIAGNOSIS — E039 Hypothyroidism, unspecified: Secondary | ICD-10-CM | POA: Diagnosis not present

## 2021-03-19 DIAGNOSIS — H26491 Other secondary cataract, right eye: Secondary | ICD-10-CM | POA: Diagnosis not present

## 2021-03-19 DIAGNOSIS — H538 Other visual disturbances: Secondary | ICD-10-CM | POA: Diagnosis not present

## 2021-03-19 DIAGNOSIS — E119 Type 2 diabetes mellitus without complications: Secondary | ICD-10-CM | POA: Diagnosis not present

## 2021-03-19 DIAGNOSIS — H04213 Epiphora due to excess lacrimation, bilateral lacrimal glands: Secondary | ICD-10-CM | POA: Diagnosis not present

## 2021-04-29 DIAGNOSIS — R351 Nocturia: Secondary | ICD-10-CM | POA: Diagnosis not present

## 2021-04-29 DIAGNOSIS — E039 Hypothyroidism, unspecified: Secondary | ICD-10-CM | POA: Diagnosis not present

## 2021-04-29 DIAGNOSIS — E559 Vitamin D deficiency, unspecified: Secondary | ICD-10-CM | POA: Diagnosis not present

## 2021-04-29 DIAGNOSIS — I10 Essential (primary) hypertension: Secondary | ICD-10-CM | POA: Diagnosis not present

## 2021-04-29 DIAGNOSIS — E1169 Type 2 diabetes mellitus with other specified complication: Secondary | ICD-10-CM | POA: Diagnosis not present

## 2021-04-29 DIAGNOSIS — Z125 Encounter for screening for malignant neoplasm of prostate: Secondary | ICD-10-CM | POA: Diagnosis not present

## 2021-04-29 DIAGNOSIS — E785 Hyperlipidemia, unspecified: Secondary | ICD-10-CM | POA: Diagnosis not present

## 2021-05-11 DIAGNOSIS — Z87891 Personal history of nicotine dependence: Secondary | ICD-10-CM | POA: Diagnosis not present

## 2021-05-11 DIAGNOSIS — J449 Chronic obstructive pulmonary disease, unspecified: Secondary | ICD-10-CM | POA: Diagnosis not present

## 2021-05-11 DIAGNOSIS — M5030 Other cervical disc degeneration, unspecified cervical region: Secondary | ICD-10-CM | POA: Diagnosis not present

## 2021-05-11 DIAGNOSIS — M6530 Trigger finger, unspecified finger: Secondary | ICD-10-CM | POA: Diagnosis not present

## 2021-05-11 DIAGNOSIS — E119 Type 2 diabetes mellitus without complications: Secondary | ICD-10-CM | POA: Diagnosis not present

## 2021-05-11 DIAGNOSIS — E785 Hyperlipidemia, unspecified: Secondary | ICD-10-CM | POA: Diagnosis not present

## 2021-05-11 DIAGNOSIS — I10 Essential (primary) hypertension: Secondary | ICD-10-CM | POA: Diagnosis not present

## 2021-05-11 DIAGNOSIS — R29898 Other symptoms and signs involving the musculoskeletal system: Secondary | ICD-10-CM | POA: Diagnosis not present

## 2021-05-11 DIAGNOSIS — E039 Hypothyroidism, unspecified: Secondary | ICD-10-CM | POA: Diagnosis not present

## 2021-05-15 ENCOUNTER — Other Ambulatory Visit: Payer: Self-pay | Admitting: Family Medicine

## 2021-05-15 DIAGNOSIS — Z87891 Personal history of nicotine dependence: Secondary | ICD-10-CM

## 2021-05-25 DIAGNOSIS — Z6828 Body mass index (BMI) 28.0-28.9, adult: Secondary | ICD-10-CM | POA: Diagnosis not present

## 2021-05-25 DIAGNOSIS — E785 Hyperlipidemia, unspecified: Secondary | ICD-10-CM | POA: Diagnosis not present

## 2021-05-25 DIAGNOSIS — E559 Vitamin D deficiency, unspecified: Secondary | ICD-10-CM | POA: Diagnosis not present

## 2021-05-25 DIAGNOSIS — E039 Hypothyroidism, unspecified: Secondary | ICD-10-CM | POA: Diagnosis not present

## 2021-05-25 DIAGNOSIS — R809 Proteinuria, unspecified: Secondary | ICD-10-CM | POA: Diagnosis not present

## 2021-05-25 DIAGNOSIS — I10 Essential (primary) hypertension: Secondary | ICD-10-CM | POA: Diagnosis not present

## 2021-05-25 DIAGNOSIS — E1169 Type 2 diabetes mellitus with other specified complication: Secondary | ICD-10-CM | POA: Diagnosis not present

## 2021-06-16 ENCOUNTER — Ambulatory Visit
Admission: RE | Admit: 2021-06-16 | Discharge: 2021-06-16 | Disposition: A | Discharge status: Home / Self Care | Payer: Medicare Other | Source: Ambulatory Visit | Attending: Family Medicine | Admitting: Family Medicine

## 2021-06-16 DIAGNOSIS — Z87891 Personal history of nicotine dependence: Secondary | ICD-10-CM

## 2021-07-16 DIAGNOSIS — E1169 Type 2 diabetes mellitus with other specified complication: Secondary | ICD-10-CM | POA: Diagnosis not present

## 2021-07-16 DIAGNOSIS — E559 Vitamin D deficiency, unspecified: Secondary | ICD-10-CM | POA: Diagnosis not present

## 2021-07-16 DIAGNOSIS — E785 Hyperlipidemia, unspecified: Secondary | ICD-10-CM | POA: Diagnosis not present

## 2021-07-16 DIAGNOSIS — E039 Hypothyroidism, unspecified: Secondary | ICD-10-CM | POA: Diagnosis not present

## 2021-07-16 DIAGNOSIS — R809 Proteinuria, unspecified: Secondary | ICD-10-CM | POA: Diagnosis not present

## 2021-07-16 DIAGNOSIS — I10 Essential (primary) hypertension: Secondary | ICD-10-CM | POA: Diagnosis not present

## 2021-07-23 DIAGNOSIS — E039 Hypothyroidism, unspecified: Secondary | ICD-10-CM | POA: Diagnosis not present

## 2021-07-23 DIAGNOSIS — Z6828 Body mass index (BMI) 28.0-28.9, adult: Secondary | ICD-10-CM | POA: Diagnosis not present

## 2021-07-23 DIAGNOSIS — I10 Essential (primary) hypertension: Secondary | ICD-10-CM | POA: Diagnosis not present

## 2021-07-23 DIAGNOSIS — K5900 Constipation, unspecified: Secondary | ICD-10-CM | POA: Diagnosis not present

## 2021-07-23 DIAGNOSIS — E559 Vitamin D deficiency, unspecified: Secondary | ICD-10-CM | POA: Diagnosis not present

## 2021-07-23 DIAGNOSIS — E1169 Type 2 diabetes mellitus with other specified complication: Secondary | ICD-10-CM | POA: Diagnosis not present

## 2021-07-23 DIAGNOSIS — R809 Proteinuria, unspecified: Secondary | ICD-10-CM | POA: Diagnosis not present

## 2021-07-23 DIAGNOSIS — E785 Hyperlipidemia, unspecified: Secondary | ICD-10-CM | POA: Diagnosis not present

## 2021-11-06 DIAGNOSIS — E039 Hypothyroidism, unspecified: Secondary | ICD-10-CM | POA: Diagnosis not present

## 2021-11-06 DIAGNOSIS — E559 Vitamin D deficiency, unspecified: Secondary | ICD-10-CM | POA: Diagnosis not present

## 2021-11-06 DIAGNOSIS — E785 Hyperlipidemia, unspecified: Secondary | ICD-10-CM | POA: Diagnosis not present

## 2021-11-06 DIAGNOSIS — E1169 Type 2 diabetes mellitus with other specified complication: Secondary | ICD-10-CM | POA: Diagnosis not present

## 2021-11-06 DIAGNOSIS — I10 Essential (primary) hypertension: Secondary | ICD-10-CM | POA: Diagnosis not present

## 2021-11-06 DIAGNOSIS — R809 Proteinuria, unspecified: Secondary | ICD-10-CM | POA: Diagnosis not present

## 2021-11-13 DIAGNOSIS — M1990 Unspecified osteoarthritis, unspecified site: Secondary | ICD-10-CM | POA: Diagnosis not present

## 2021-11-13 DIAGNOSIS — Z125 Encounter for screening for malignant neoplasm of prostate: Secondary | ICD-10-CM | POA: Diagnosis not present

## 2021-11-13 DIAGNOSIS — E785 Hyperlipidemia, unspecified: Secondary | ICD-10-CM | POA: Diagnosis not present

## 2021-11-13 DIAGNOSIS — E1129 Type 2 diabetes mellitus with other diabetic kidney complication: Secondary | ICD-10-CM | POA: Diagnosis not present

## 2021-11-13 DIAGNOSIS — Z0000 Encounter for general adult medical examination without abnormal findings: Secondary | ICD-10-CM | POA: Diagnosis not present

## 2021-11-13 DIAGNOSIS — E039 Hypothyroidism, unspecified: Secondary | ICD-10-CM | POA: Diagnosis not present

## 2021-11-13 DIAGNOSIS — K5900 Constipation, unspecified: Secondary | ICD-10-CM | POA: Diagnosis not present

## 2021-11-13 DIAGNOSIS — M5030 Other cervical disc degeneration, unspecified cervical region: Secondary | ICD-10-CM | POA: Diagnosis not present

## 2021-11-13 DIAGNOSIS — N400 Enlarged prostate without lower urinary tract symptoms: Secondary | ICD-10-CM | POA: Diagnosis not present

## 2021-11-13 DIAGNOSIS — E559 Vitamin D deficiency, unspecified: Secondary | ICD-10-CM | POA: Diagnosis not present

## 2021-11-13 DIAGNOSIS — I10 Essential (primary) hypertension: Secondary | ICD-10-CM | POA: Diagnosis not present

## 2021-11-13 DIAGNOSIS — J449 Chronic obstructive pulmonary disease, unspecified: Secondary | ICD-10-CM | POA: Diagnosis not present

## 2022-05-03 DIAGNOSIS — E0500 Thyrotoxicosis with diffuse goiter without thyrotoxic crisis or storm: Secondary | ICD-10-CM | POA: Diagnosis not present

## 2022-05-03 DIAGNOSIS — H35372 Puckering of macula, left eye: Secondary | ICD-10-CM | POA: Diagnosis not present

## 2022-05-03 DIAGNOSIS — E119 Type 2 diabetes mellitus without complications: Secondary | ICD-10-CM | POA: Diagnosis not present

## 2022-05-03 DIAGNOSIS — H35033 Hypertensive retinopathy, bilateral: Secondary | ICD-10-CM | POA: Diagnosis not present

## 2022-05-10 DIAGNOSIS — E785 Hyperlipidemia, unspecified: Secondary | ICD-10-CM | POA: Diagnosis not present

## 2022-05-10 DIAGNOSIS — I10 Essential (primary) hypertension: Secondary | ICD-10-CM | POA: Diagnosis not present

## 2022-05-10 DIAGNOSIS — E559 Vitamin D deficiency, unspecified: Secondary | ICD-10-CM | POA: Diagnosis not present

## 2022-05-10 DIAGNOSIS — E039 Hypothyroidism, unspecified: Secondary | ICD-10-CM | POA: Diagnosis not present

## 2022-05-10 DIAGNOSIS — E1129 Type 2 diabetes mellitus with other diabetic kidney complication: Secondary | ICD-10-CM | POA: Diagnosis not present

## 2022-05-10 DIAGNOSIS — Z125 Encounter for screening for malignant neoplasm of prostate: Secondary | ICD-10-CM | POA: Diagnosis not present

## 2022-05-10 DIAGNOSIS — N400 Enlarged prostate without lower urinary tract symptoms: Secondary | ICD-10-CM | POA: Diagnosis not present

## 2022-05-17 DIAGNOSIS — N400 Enlarged prostate without lower urinary tract symptoms: Secondary | ICD-10-CM | POA: Diagnosis not present

## 2022-05-17 DIAGNOSIS — E1129 Type 2 diabetes mellitus with other diabetic kidney complication: Secondary | ICD-10-CM | POA: Diagnosis not present

## 2022-05-17 DIAGNOSIS — E785 Hyperlipidemia, unspecified: Secondary | ICD-10-CM | POA: Diagnosis not present

## 2022-05-17 DIAGNOSIS — I10 Essential (primary) hypertension: Secondary | ICD-10-CM | POA: Diagnosis not present

## 2022-05-17 DIAGNOSIS — J449 Chronic obstructive pulmonary disease, unspecified: Secondary | ICD-10-CM | POA: Diagnosis not present

## 2022-05-17 DIAGNOSIS — E039 Hypothyroidism, unspecified: Secondary | ICD-10-CM | POA: Diagnosis not present

## 2022-08-27 DIAGNOSIS — I10 Essential (primary) hypertension: Secondary | ICD-10-CM | POA: Diagnosis not present

## 2022-08-27 DIAGNOSIS — E1129 Type 2 diabetes mellitus with other diabetic kidney complication: Secondary | ICD-10-CM | POA: Diagnosis not present

## 2022-09-06 DIAGNOSIS — N529 Male erectile dysfunction, unspecified: Secondary | ICD-10-CM | POA: Diagnosis not present

## 2022-09-06 DIAGNOSIS — E039 Hypothyroidism, unspecified: Secondary | ICD-10-CM | POA: Diagnosis not present

## 2022-09-06 DIAGNOSIS — E559 Vitamin D deficiency, unspecified: Secondary | ICD-10-CM | POA: Diagnosis not present

## 2022-09-06 DIAGNOSIS — Z79899 Other long term (current) drug therapy: Secondary | ICD-10-CM | POA: Diagnosis not present

## 2022-09-06 DIAGNOSIS — E1129 Type 2 diabetes mellitus with other diabetic kidney complication: Secondary | ICD-10-CM | POA: Diagnosis not present

## 2022-09-06 DIAGNOSIS — I10 Essential (primary) hypertension: Secondary | ICD-10-CM | POA: Diagnosis not present

## 2022-09-06 DIAGNOSIS — R809 Proteinuria, unspecified: Secondary | ICD-10-CM | POA: Diagnosis not present

## 2022-09-06 DIAGNOSIS — E538 Deficiency of other specified B group vitamins: Secondary | ICD-10-CM | POA: Diagnosis not present

## 2022-09-06 DIAGNOSIS — N400 Enlarged prostate without lower urinary tract symptoms: Secondary | ICD-10-CM | POA: Diagnosis not present

## 2022-09-06 DIAGNOSIS — J449 Chronic obstructive pulmonary disease, unspecified: Secondary | ICD-10-CM | POA: Diagnosis not present

## 2022-09-06 DIAGNOSIS — E785 Hyperlipidemia, unspecified: Secondary | ICD-10-CM | POA: Diagnosis not present

## 2022-11-24 DIAGNOSIS — M791 Myalgia, unspecified site: Secondary | ICD-10-CM | POA: Diagnosis not present

## 2022-11-24 DIAGNOSIS — M62838 Other muscle spasm: Secondary | ICD-10-CM | POA: Diagnosis not present

## 2022-12-02 DIAGNOSIS — I10 Essential (primary) hypertension: Secondary | ICD-10-CM | POA: Diagnosis not present

## 2022-12-02 DIAGNOSIS — E559 Vitamin D deficiency, unspecified: Secondary | ICD-10-CM | POA: Diagnosis not present

## 2022-12-02 DIAGNOSIS — E538 Deficiency of other specified B group vitamins: Secondary | ICD-10-CM | POA: Diagnosis not present

## 2022-12-02 DIAGNOSIS — E1129 Type 2 diabetes mellitus with other diabetic kidney complication: Secondary | ICD-10-CM | POA: Diagnosis not present

## 2022-12-02 DIAGNOSIS — E785 Hyperlipidemia, unspecified: Secondary | ICD-10-CM | POA: Diagnosis not present

## 2022-12-02 DIAGNOSIS — R809 Proteinuria, unspecified: Secondary | ICD-10-CM | POA: Diagnosis not present

## 2022-12-09 DIAGNOSIS — E1129 Type 2 diabetes mellitus with other diabetic kidney complication: Secondary | ICD-10-CM | POA: Diagnosis not present

## 2022-12-09 DIAGNOSIS — M62838 Other muscle spasm: Secondary | ICD-10-CM | POA: Diagnosis not present

## 2022-12-09 DIAGNOSIS — K5900 Constipation, unspecified: Secondary | ICD-10-CM | POA: Diagnosis not present

## 2022-12-09 DIAGNOSIS — Z23 Encounter for immunization: Secondary | ICD-10-CM | POA: Diagnosis not present

## 2022-12-09 DIAGNOSIS — M25511 Pain in right shoulder: Secondary | ICD-10-CM | POA: Diagnosis not present

## 2022-12-09 DIAGNOSIS — N529 Male erectile dysfunction, unspecified: Secondary | ICD-10-CM | POA: Diagnosis not present

## 2022-12-09 DIAGNOSIS — E039 Hypothyroidism, unspecified: Secondary | ICD-10-CM | POA: Diagnosis not present

## 2022-12-09 DIAGNOSIS — N400 Enlarged prostate without lower urinary tract symptoms: Secondary | ICD-10-CM | POA: Diagnosis not present

## 2022-12-09 DIAGNOSIS — Z0000 Encounter for general adult medical examination without abnormal findings: Secondary | ICD-10-CM | POA: Diagnosis not present

## 2022-12-09 DIAGNOSIS — M542 Cervicalgia: Secondary | ICD-10-CM | POA: Diagnosis not present

## 2022-12-09 DIAGNOSIS — I129 Hypertensive chronic kidney disease with stage 1 through stage 4 chronic kidney disease, or unspecified chronic kidney disease: Secondary | ICD-10-CM | POA: Diagnosis not present

## 2023-03-14 DIAGNOSIS — Z125 Encounter for screening for malignant neoplasm of prostate: Secondary | ICD-10-CM | POA: Diagnosis not present

## 2023-03-14 DIAGNOSIS — R946 Abnormal results of thyroid function studies: Secondary | ICD-10-CM | POA: Diagnosis not present

## 2023-03-14 DIAGNOSIS — Z13220 Encounter for screening for lipoid disorders: Secondary | ICD-10-CM | POA: Diagnosis not present

## 2023-03-14 DIAGNOSIS — Z79899 Other long term (current) drug therapy: Secondary | ICD-10-CM | POA: Diagnosis not present

## 2023-03-14 DIAGNOSIS — E559 Vitamin D deficiency, unspecified: Secondary | ICD-10-CM | POA: Diagnosis not present

## 2023-03-14 DIAGNOSIS — E538 Deficiency of other specified B group vitamins: Secondary | ICD-10-CM | POA: Diagnosis not present

## 2023-03-14 DIAGNOSIS — R7309 Other abnormal glucose: Secondary | ICD-10-CM | POA: Diagnosis not present

## 2023-03-21 DIAGNOSIS — I129 Hypertensive chronic kidney disease with stage 1 through stage 4 chronic kidney disease, or unspecified chronic kidney disease: Secondary | ICD-10-CM | POA: Diagnosis not present

## 2023-03-21 DIAGNOSIS — N529 Male erectile dysfunction, unspecified: Secondary | ICD-10-CM | POA: Diagnosis not present

## 2023-03-21 DIAGNOSIS — E538 Deficiency of other specified B group vitamins: Secondary | ICD-10-CM | POA: Diagnosis not present

## 2023-03-21 DIAGNOSIS — E7800 Pure hypercholesterolemia, unspecified: Secondary | ICD-10-CM | POA: Diagnosis not present

## 2023-03-21 DIAGNOSIS — E039 Hypothyroidism, unspecified: Secondary | ICD-10-CM | POA: Diagnosis not present

## 2023-03-21 DIAGNOSIS — N400 Enlarged prostate without lower urinary tract symptoms: Secondary | ICD-10-CM | POA: Diagnosis not present

## 2023-03-21 DIAGNOSIS — E1129 Type 2 diabetes mellitus with other diabetic kidney complication: Secondary | ICD-10-CM | POA: Diagnosis not present

## 2023-03-21 DIAGNOSIS — N1831 Chronic kidney disease, stage 3a: Secondary | ICD-10-CM | POA: Diagnosis not present

## 2023-03-21 DIAGNOSIS — R972 Elevated prostate specific antigen [PSA]: Secondary | ICD-10-CM | POA: Diagnosis not present

## 2023-05-05 DIAGNOSIS — E119 Type 2 diabetes mellitus without complications: Secondary | ICD-10-CM | POA: Diagnosis not present

## 2023-05-05 DIAGNOSIS — H35372 Puckering of macula, left eye: Secondary | ICD-10-CM | POA: Diagnosis not present

## 2023-05-05 DIAGNOSIS — H26491 Other secondary cataract, right eye: Secondary | ICD-10-CM | POA: Diagnosis not present

## 2023-05-05 DIAGNOSIS — H35033 Hypertensive retinopathy, bilateral: Secondary | ICD-10-CM | POA: Diagnosis not present

## 2023-05-31 DIAGNOSIS — H26493 Other secondary cataract, bilateral: Secondary | ICD-10-CM | POA: Diagnosis not present

## 2023-05-31 DIAGNOSIS — H04123 Dry eye syndrome of bilateral lacrimal glands: Secondary | ICD-10-CM | POA: Diagnosis not present

## 2023-06-13 DIAGNOSIS — E1129 Type 2 diabetes mellitus with other diabetic kidney complication: Secondary | ICD-10-CM | POA: Diagnosis not present

## 2023-06-13 DIAGNOSIS — E538 Deficiency of other specified B group vitamins: Secondary | ICD-10-CM | POA: Diagnosis not present

## 2023-06-13 DIAGNOSIS — R972 Elevated prostate specific antigen [PSA]: Secondary | ICD-10-CM | POA: Diagnosis not present

## 2023-06-13 DIAGNOSIS — I129 Hypertensive chronic kidney disease with stage 1 through stage 4 chronic kidney disease, or unspecified chronic kidney disease: Secondary | ICD-10-CM | POA: Diagnosis not present

## 2023-06-20 DIAGNOSIS — N529 Male erectile dysfunction, unspecified: Secondary | ICD-10-CM | POA: Diagnosis not present

## 2023-06-20 DIAGNOSIS — E559 Vitamin D deficiency, unspecified: Secondary | ICD-10-CM | POA: Diagnosis not present

## 2023-06-20 DIAGNOSIS — R972 Elevated prostate specific antigen [PSA]: Secondary | ICD-10-CM | POA: Diagnosis not present

## 2023-06-20 DIAGNOSIS — I129 Hypertensive chronic kidney disease with stage 1 through stage 4 chronic kidney disease, or unspecified chronic kidney disease: Secondary | ICD-10-CM | POA: Diagnosis not present

## 2023-06-20 DIAGNOSIS — E039 Hypothyroidism, unspecified: Secondary | ICD-10-CM | POA: Diagnosis not present

## 2023-06-20 DIAGNOSIS — E1129 Type 2 diabetes mellitus with other diabetic kidney complication: Secondary | ICD-10-CM | POA: Diagnosis not present

## 2023-06-20 DIAGNOSIS — N400 Enlarged prostate without lower urinary tract symptoms: Secondary | ICD-10-CM | POA: Diagnosis not present

## 2023-06-20 DIAGNOSIS — E7800 Pure hypercholesterolemia, unspecified: Secondary | ICD-10-CM | POA: Diagnosis not present

## 2023-06-20 DIAGNOSIS — N1831 Chronic kidney disease, stage 3a: Secondary | ICD-10-CM | POA: Diagnosis not present

## 2023-06-20 DIAGNOSIS — J449 Chronic obstructive pulmonary disease, unspecified: Secondary | ICD-10-CM | POA: Diagnosis not present

## 2023-07-26 DIAGNOSIS — M5431 Sciatica, right side: Secondary | ICD-10-CM | POA: Diagnosis not present

## 2023-07-26 DIAGNOSIS — M545 Low back pain, unspecified: Secondary | ICD-10-CM | POA: Diagnosis not present

## 2023-07-26 DIAGNOSIS — M47816 Spondylosis without myelopathy or radiculopathy, lumbar region: Secondary | ICD-10-CM | POA: Diagnosis not present

## 2023-07-26 DIAGNOSIS — M899 Disorder of bone, unspecified: Secondary | ICD-10-CM | POA: Diagnosis not present

## 2023-07-26 DIAGNOSIS — M5416 Radiculopathy, lumbar region: Secondary | ICD-10-CM | POA: Diagnosis not present

## 2023-07-26 DIAGNOSIS — M25551 Pain in right hip: Secondary | ICD-10-CM | POA: Diagnosis not present

## 2023-07-26 DIAGNOSIS — M5410 Radiculopathy, site unspecified: Secondary | ICD-10-CM | POA: Diagnosis not present

## 2023-07-27 ENCOUNTER — Telehealth: Payer: Self-pay | Admitting: Hematology and Oncology

## 2023-07-27 NOTE — Telephone Encounter (Signed)
 07/27/23 Spoke with patient and confirmed next appt.

## 2023-08-01 ENCOUNTER — Other Ambulatory Visit: Payer: Self-pay | Admitting: Hematology and Oncology

## 2023-08-01 DIAGNOSIS — M899 Disorder of bone, unspecified: Secondary | ICD-10-CM

## 2023-08-02 NOTE — Progress Notes (Unsigned)
 [facility] [address] [place],  [place]  [address] [phone]  Clinic Day:  08/02/2023   Referring physician: Vanita Gens, MD  Patient Care Team: Patient Care Team: Vanita Gens, MD as PCP - General (Internal Medicine)   REASON FOR CONSULTATION:  Spinal lesion  HISTORY OF PRESENT ILLNESS:  Douglas Riley is a 73 y.o. male with a history of hip/back pain who is referred in consultation by Vanita Gens, MD for assessment and management. Patient presented to the ED 07/26/2023 with complaints of right hip pain and lower back pain with radiculopathy down lower extremity on the right side. This occurred after a day of gardening. 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 He states his pain has improved to a 5 from a 10 the night in the ED. He requires a walker for ambulation and a wheelchair for longer distances. He was also found to have a slightly elevated PSA at 4.9 with a referral given by the ED for urology. He is scheduled to see them 08/25/2023. He denies fever, chills, nausea or vomiting. He denies issue with bowel or bladder. He denies chest pain, cough or shortness of breath. Medical history is significant for COPD, diabetes, hypertension, thyroid  disease. Surgical history consists of a repair to a left leg fracture. Family history is unremarkable other that a sister who died of lung cancer secondary to smoking.    REVIEW OF SYSTEMS:  Review of Systems  Constitutional: Negative.   HENT:  Negative.    Eyes: Negative.   Respiratory: Negative.    Cardiovascular: Negative.   Gastrointestinal: Negative.   Endocrine: Negative.   Genitourinary: Negative.    Musculoskeletal:  Positive for back pain and gait problem.  Skin: Negative.   Neurological:  Positive for extremity weakness and gait problem.  Hematological: Negative.   Psychiatric/Behavioral: Negative.       VITALS:  There were no vitals taken for this visit.  Wt  Readings from Last 3 Encounters:  No data found for Wt    There is no height or weight on file to calculate BMI.  Performance status (ECOG): 2 - Symptomatic, <50% confined to bed  PHYSICAL EXAM:  Physical Exam Constitutional:      Appearance: Normal appearance. He is normal weight.  HENT:     Head: Normocephalic.     Mouth/Throat:     Mouth: Mucous membranes are moist.  Eyes:     Pupils: Pupils are equal, round, and reactive to light.  Cardiovascular:     Rate and Rhythm: Normal rate and regular rhythm.  Pulmonary:     Effort: Pulmonary effort is normal.     Breath sounds: Normal breath sounds.  Abdominal:     General: Abdomen is flat. Bowel sounds are normal.     Palpations: Abdomen is soft.  Musculoskeletal:        General: Normal range of motion.  Skin:    General: Skin is warm and dry.  Neurological:     General: No focal deficit present.     Mental Status: He is alert and oriented to person, place, and time. Mental status is at baseline.  Psychiatric:        Mood and Affect: Mood normal.        Behavior: Behavior normal.        Thought Content: Thought content normal.        Judgment: Judgment normal.  LABS:      Latest Ref Rng & Units 10/02/2017    9:30 AM 08/31/2014    8:56 PM  CBC  WBC 4.0 - 10.5 K/uL 12.1  11.1   Hemoglobin 13.0 - 17.0 g/dL 72.5  36.6   Hematocrit 39.0 - 52.0 % 47.9  46.3   Platelets 150 - 400 K/uL 175  160       Latest Ref Rng & Units 10/02/2017    9:30 AM 08/31/2014    8:56 PM  CMP  Glucose 70 - 99 mg/dL 440  347   BUN 8 - 23 mg/dL 11  15   Creatinine 4.25 - 1.24 mg/dL 9.56  3.87   Sodium 564 - 145 mmol/L 138  139   Potassium 3.5 - 5.1 mmol/L 4.1  3.7   Chloride 98 - 111 mmol/L 103  103   CO2 22 - 32 mmol/L 26  25   Calcium 8.9 - 10.3 mg/dL 9.7  9.9   Total Protein 6.5 - 8.1 g/dL 7.8  7.7   Total Bilirubin 0.3 - 1.2 mg/dL 1.4  0.7   Alkaline Phos 38 - 126 U/L 55  57   AST 15 - 41 U/L 19  29   ALT 0 - 44 U/L 15  24       No results found for: "CEA1", "CEA" / No results found for: "CEA1", "CEA" No results found for: "PSA1" No results found for: "PPI951" No results found for: "CAN125"  No results found for: "TOTALPROTELP", "ALBUMINELP", "A1GS", "A2GS", "BETS", "BETA2SER", "GAMS", "MSPIKE", "SPEI" No results found for: "TIBC", "FERRITIN", "IRONPCTSAT" No results found for: "LDH"  STUDIES:  No results found.    HISTORY:   Past Medical History:  Diagnosis Date   Diabetes mellitus without complication (HCC)    Hypertension    Thyroid  disease     Past Surgical History:  Procedure Laterality Date   FRACTURE SURGERY      No family history on file.  Social History:  reports that he has never smoked. He has never used smokeless tobacco. He reports current alcohol use. No history on file for drug use.The patient is accompanied by wife today.  Allergies: No Known Allergies  Current Medications: Current Outpatient Medications  Medication Sig Dispense Refill   amLODipine-benazepril (LOTREL) 10-20 MG capsule Take 1 tablet by mouth daily.     aspirin EC 81 MG tablet Take 81 mg by mouth daily.     celecoxib (CELEBREX) 200 MG capsule Take 200 mg by mouth daily as needed for mild pain.      CRESTOR 20 MG tablet Take 20 mg by mouth daily.  0   metFORMIN (GLUCOPHAGE-XR) 500 MG 24 hr tablet Take 1,000 mg by mouth 2 (two) times daily.     SYNTHROID 137 MCG tablet Take 137 mcg by mouth See admin instructions.   0   TRULICITY 1.5 MG/0.5ML SOPN Inject 1.5 mg into the skin every Wednesday.     VITAMIN D, CHOLECALCIFEROL, PO Take 1 tablet by mouth daily.     No current facility-administered medications for this visit.     ASSESSMENT & PLAN:   Assessment:  Douglas Riley is a 73 y.o. male who presented to the ED with hip/back pain and was found to have a 1.3 x 1.2 cm blastic focus in the left L3 vetebral body. We discussed the need for further diagnostics including PET imaging to assess for any spread  of disease. Once PET is obtained, if indicated,  we will obtain biopsy.   Plan: 1.  PET scan and possible bone biopsy.   I discussed the assessment and treatment plan with the patient.  The patient was provided an opportunity to ask questions and all were answered.  The patient agreed with the plan and demonstrated an understanding of the instructions.    Thank you for the referral    60 minutes was spent in patient care.  This included time spent preparing to see the patient (e.g., review of tests), obtaining and/or reviewing separately obtained history, counseling and educating the patient/family/caregiver, ordering medications, tests, or procedures; documenting clinical information in the electronic or other health record, independently interpreting results and communicating results to the patient/family/caregiver as well as coordination of care.      Adelaide Adjutant, NP   Physician Assistant [facility]  [phone]

## 2023-08-03 ENCOUNTER — Inpatient Hospital Stay

## 2023-08-03 ENCOUNTER — Inpatient Hospital Stay: Attending: Hematology and Oncology | Admitting: Hematology and Oncology

## 2023-08-03 ENCOUNTER — Encounter: Payer: Self-pay | Admitting: Hematology and Oncology

## 2023-08-03 ENCOUNTER — Other Ambulatory Visit: Payer: Self-pay

## 2023-08-03 ENCOUNTER — Telehealth: Payer: Self-pay | Admitting: Hematology and Oncology

## 2023-08-03 VITALS — BP 127/85 | HR 93 | Resp 18 | Ht 66.0 in | Wt 174.6 lb

## 2023-08-03 DIAGNOSIS — I10 Essential (primary) hypertension: Secondary | ICD-10-CM | POA: Diagnosis not present

## 2023-08-03 DIAGNOSIS — E119 Type 2 diabetes mellitus without complications: Secondary | ICD-10-CM | POA: Diagnosis not present

## 2023-08-03 DIAGNOSIS — J449 Chronic obstructive pulmonary disease, unspecified: Secondary | ICD-10-CM | POA: Insufficient documentation

## 2023-08-03 DIAGNOSIS — R937 Abnormal findings on diagnostic imaging of other parts of musculoskeletal system: Secondary | ICD-10-CM | POA: Diagnosis not present

## 2023-08-03 DIAGNOSIS — M25551 Pain in right hip: Secondary | ICD-10-CM | POA: Insufficient documentation

## 2023-08-03 DIAGNOSIS — M899 Disorder of bone, unspecified: Secondary | ICD-10-CM

## 2023-08-03 DIAGNOSIS — R531 Weakness: Secondary | ICD-10-CM | POA: Insufficient documentation

## 2023-08-03 DIAGNOSIS — R972 Elevated prostate specific antigen [PSA]: Secondary | ICD-10-CM | POA: Diagnosis not present

## 2023-08-03 DIAGNOSIS — Z79899 Other long term (current) drug therapy: Secondary | ICD-10-CM | POA: Diagnosis not present

## 2023-08-03 DIAGNOSIS — M51372 Other intervertebral disc degeneration, lumbosacral region with discogenic back pain and lower extremity pain: Secondary | ICD-10-CM | POA: Diagnosis not present

## 2023-08-03 LAB — CBC WITH DIFFERENTIAL (CANCER CENTER ONLY)
Abs Immature Granulocytes: 0.03 10*3/uL (ref 0.00–0.07)
Basophils Absolute: 0.1 10*3/uL (ref 0.0–0.1)
Basophils Relative: 1 %
Eosinophils Absolute: 0.4 10*3/uL (ref 0.0–0.5)
Eosinophils Relative: 4 %
HCT: 49.6 % (ref 39.0–52.0)
Hemoglobin: 17.7 g/dL — ABNORMAL HIGH (ref 13.0–17.0)
Immature Granulocytes: 0 %
Lymphocytes Relative: 21 %
Lymphs Abs: 2.2 10*3/uL (ref 0.7–4.0)
MCH: 31.3 pg (ref 26.0–34.0)
MCHC: 35.7 g/dL (ref 30.0–36.0)
MCV: 87.6 fL (ref 80.0–100.0)
Monocytes Absolute: 0.7 10*3/uL (ref 0.1–1.0)
Monocytes Relative: 7 %
Neutro Abs: 7.3 10*3/uL (ref 1.7–7.7)
Neutrophils Relative %: 67 %
Platelet Count: 173 10*3/uL (ref 150–400)
RBC: 5.66 MIL/uL (ref 4.22–5.81)
RDW: 13.6 % (ref 11.5–15.5)
WBC Count: 10.7 10*3/uL — ABNORMAL HIGH (ref 4.0–10.5)
nRBC: 0 % (ref 0.0–0.2)
nRBC: 0 /100{WBCs}

## 2023-08-03 LAB — CMP (CANCER CENTER ONLY)
ALT: 30 U/L (ref 0–44)
AST: 25 U/L (ref 15–41)
Albumin: 4.1 g/dL (ref 3.5–5.0)
Alkaline Phosphatase: 84 U/L (ref 38–126)
Anion gap: 12 (ref 5–15)
BUN: 18 mg/dL (ref 8–23)
CO2: 27 mmol/L (ref 22–32)
Calcium: 10.3 mg/dL (ref 8.9–10.3)
Chloride: 99 mmol/L (ref 98–111)
Creatinine: 1.2 mg/dL (ref 0.61–1.24)
GFR, Estimated: >60 mL/min (ref >60)
Glucose, Bld: 156 mg/dL — ABNORMAL HIGH (ref 70–99)
Potassium: 3.8 mmol/L (ref 3.5–5.1)
Sodium: 138 mmol/L (ref 135–145)
Total Bilirubin: 1 mg/dL (ref 0.0–1.2)
Total Protein: 7.6 g/dL (ref 6.5–8.1)

## 2023-08-03 NOTE — Telephone Encounter (Signed)
 Patient has been scheduled for follow-up visit per 07/3023 LOS.  Pt aware of scheduled appt details.

## 2023-08-09 DIAGNOSIS — R937 Abnormal findings on diagnostic imaging of other parts of musculoskeletal system: Secondary | ICD-10-CM | POA: Diagnosis not present

## 2023-08-09 DIAGNOSIS — M51362 Other intervertebral disc degeneration, lumbar region with discogenic back pain and lower extremity pain: Secondary | ICD-10-CM | POA: Diagnosis not present

## 2023-08-09 DIAGNOSIS — R972 Elevated prostate specific antigen [PSA]: Secondary | ICD-10-CM | POA: Diagnosis not present

## 2023-08-15 ENCOUNTER — Ambulatory Visit (HOSPITAL_BASED_OUTPATIENT_CLINIC_OR_DEPARTMENT_OTHER)
Admission: RE | Admit: 2023-08-15 | Discharge: 2023-08-15 | Disposition: A | Discharge status: Home / Self Care | Source: Ambulatory Visit | Attending: Hematology and Oncology | Admitting: Hematology and Oncology

## 2023-08-15 DIAGNOSIS — N200 Calculus of kidney: Secondary | ICD-10-CM | POA: Diagnosis not present

## 2023-08-15 DIAGNOSIS — K8020 Calculus of gallbladder without cholecystitis without obstruction: Secondary | ICD-10-CM | POA: Diagnosis not present

## 2023-08-15 DIAGNOSIS — R937 Abnormal findings on diagnostic imaging of other parts of musculoskeletal system: Secondary | ICD-10-CM

## 2023-08-15 DIAGNOSIS — K769 Liver disease, unspecified: Secondary | ICD-10-CM | POA: Diagnosis not present

## 2023-08-15 DIAGNOSIS — I2510 Atherosclerotic heart disease of native coronary artery without angina pectoris: Secondary | ICD-10-CM | POA: Diagnosis not present

## 2023-08-15 DIAGNOSIS — M899 Disorder of bone, unspecified: Secondary | ICD-10-CM

## 2023-08-15 MED ORDER — FLUDEOXYGLUCOSE F - 18 (FDG) INJECTION: 8.7800 | Freq: Once | INTRAVENOUS | Status: DC | PRN

## 2023-08-18 ENCOUNTER — Other Ambulatory Visit: Payer: Self-pay

## 2023-08-18 ENCOUNTER — Inpatient Hospital Stay: Attending: Hematology and Oncology | Admitting: Hematology and Oncology

## 2023-08-18 VITALS — BP 119/81 | HR 88 | Temp 98.2°F | Resp 16 | Ht 66.0 in | Wt 173.4 lb

## 2023-08-18 DIAGNOSIS — Z79899 Other long term (current) drug therapy: Secondary | ICD-10-CM | POA: Diagnosis not present

## 2023-08-18 DIAGNOSIS — Z7985 Long-term (current) use of injectable non-insulin antidiabetic drugs: Secondary | ICD-10-CM | POA: Insufficient documentation

## 2023-08-18 DIAGNOSIS — Z823 Family history of stroke: Secondary | ICD-10-CM | POA: Insufficient documentation

## 2023-08-18 DIAGNOSIS — M25551 Pain in right hip: Secondary | ICD-10-CM | POA: Diagnosis not present

## 2023-08-18 DIAGNOSIS — E079 Disorder of thyroid, unspecified: Secondary | ICD-10-CM | POA: Insufficient documentation

## 2023-08-18 DIAGNOSIS — Z801 Family history of malignant neoplasm of trachea, bronchus and lung: Secondary | ICD-10-CM | POA: Diagnosis not present

## 2023-08-18 DIAGNOSIS — E119 Type 2 diabetes mellitus without complications: Secondary | ICD-10-CM | POA: Insufficient documentation

## 2023-08-18 DIAGNOSIS — Z809 Family history of malignant neoplasm, unspecified: Secondary | ICD-10-CM | POA: Diagnosis not present

## 2023-08-18 DIAGNOSIS — J449 Chronic obstructive pulmonary disease, unspecified: Secondary | ICD-10-CM | POA: Diagnosis not present

## 2023-08-18 DIAGNOSIS — R937 Abnormal findings on diagnostic imaging of other parts of musculoskeletal system: Secondary | ICD-10-CM

## 2023-08-18 DIAGNOSIS — R972 Elevated prostate specific antigen [PSA]: Secondary | ICD-10-CM | POA: Insufficient documentation

## 2023-08-18 DIAGNOSIS — Z8249 Family history of ischemic heart disease and other diseases of the circulatory system: Secondary | ICD-10-CM | POA: Diagnosis not present

## 2023-08-18 DIAGNOSIS — M51372 Other intervertebral disc degeneration, lumbosacral region with discogenic back pain and lower extremity pain: Secondary | ICD-10-CM | POA: Insufficient documentation

## 2023-08-18 DIAGNOSIS — Z87891 Personal history of nicotine dependence: Secondary | ICD-10-CM | POA: Insufficient documentation

## 2023-08-18 DIAGNOSIS — M899 Disorder of bone, unspecified: Secondary | ICD-10-CM | POA: Diagnosis not present

## 2023-08-18 DIAGNOSIS — Z818 Family history of other mental and behavioral disorders: Secondary | ICD-10-CM | POA: Insufficient documentation

## 2023-08-18 DIAGNOSIS — I10 Essential (primary) hypertension: Secondary | ICD-10-CM | POA: Insufficient documentation

## 2023-08-18 DIAGNOSIS — Z8349 Family history of other endocrine, nutritional and metabolic diseases: Secondary | ICD-10-CM | POA: Insufficient documentation

## 2023-08-18 DIAGNOSIS — N200 Calculus of kidney: Secondary | ICD-10-CM | POA: Insufficient documentation

## 2023-08-18 NOTE — Addendum Note (Signed)
 Addended by: Baldomero Bone on: 08/18/2023 11:32 AM   Modules accepted: Orders

## 2023-08-18 NOTE — Progress Notes (Cosign Needed)
 [facility]  [address] [place],  [place]  [address] [phone]  Clinic Day:  08/18/2023  Referring physician: Pete Brand, DO  ASSESSMENT & PLAN:   Assessment & Plan: Douglas Riley is a 73 y.o. male with a history of hip/back pain who is referred in consultation by Vanita Gens, MD for assessment and management. Patient presented to the ED 07/26/2023 with complaints of right hip pain and lower back pain with radiculopathy down lower extremity on the right side. This occurred after a day of gardening. 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 He states his pain has improved to a 5 from a 10 the night in the ED. He requires a walker for ambulation and a wheelchair for longer distances. He was also found to have a slightly elevated PSA at 4.9 with a referral given by the ED for urology. He is scheduled to see them 08/25/2023. He denies fever, chills, nausea or vomiting. He denies issue with bowel or bladder. He denies chest pain, cough or shortness of breath. Medical history is significant for COPD, diabetes, hypertension, thyroid  disease. Surgical history consists of a repair to a left leg fracture. Family history is unremarkable other that a sister who died of lung cancer secondary to smoking.   He is here today to review PET imaging which revealed no evidence of abnormal radiotracer uptake. The imaging was compared to a CT scan from 2016 and this lesion was noted then, although it was smaller. We discussed that this lesion does not show concern for malignant disease. He notes his pain has improved as well as his mobility. He notes rearranging the furniture this week in his living room. He has an appointment this month with orthopedics and urology. He will not need further follow up with our office unless he has new concerns.   The patient understands the plans discussed today and is in agreement with them.  He knows to contact our  office if he develops concerns prior to his next appointment.   I provided 30 minutes of face-to-face time during this encounter and > 50% was spent counseling as documented under my assessment and plan.    Adelaide Adjutant, NP  [facility] - [facility] DE[facility] OF [facility] [address] [place] [address] Dept: [phone] Dept Fax: [phone]   Orders Placed This Encounter  Procedures   PSA    Standing Status:   Future    Expiration Date:   08/17/2024      CHIEF COMPLAINT:  CC: Spinal lesion  Current Treatment:  None  HISTORY OF PRESENT ILLNESS:   Oncology History   No history exists.      INTERVAL HISTORY:  Claudius is here today for repeat clinical assessment. He denies fevers or chills. He denies pain. His appetite is good. His weight has been stable.  REVIEW OF SYSTEMS:  Review of Systems  Constitutional: Negative.   HENT:  Negative.    Eyes: Negative.   Respiratory: Negative.    Cardiovascular: Negative.   Gastrointestinal: Negative.   Endocrine: Negative.   Genitourinary: Negative.    Musculoskeletal: Negative.   Skin: Negative.   Neurological: Negative.   Hematological: Negative.   Psychiatric/Behavioral: Negative.       VITALS:   Blood pressure 119/81, pulse 88, temperature 98.2 F (36.8 C), temperature source Oral, resp. rate 16, height 5\' 6"  (1.676  m), weight 173 lb 6.4 oz (78.7 kg), SpO2 94%.  Wt Readings from Last 3 Encounters:  08/18/23 173 lb 6.4 oz (78.7 kg)  08/03/23 174 lb 9.6 oz (79.2 kg)    Body mass index is 27.99 kg/m.  Performance status (ECOG): 1 - Symptomatic but completely ambulatory    PHYSICAL EXAM:  Physical Exam Constitutional:      Appearance: Normal appearance. He is normal weight.  HENT:     Head: Normocephalic and atraumatic.     Mouth/Throat:     Mouth: Mucous membranes are moist.  Cardiovascular:     Rate and Rhythm: Normal rate and regular rhythm.      Pulses: Normal pulses.     Heart sounds: Normal heart sounds.  Pulmonary:     Effort: Pulmonary effort is normal.     Breath sounds: Normal breath sounds.  Abdominal:     General: Abdomen is flat.     Palpations: Abdomen is soft.  Musculoskeletal:        General: Normal range of motion.     Cervical back: Normal range of motion.  Skin:    General: Skin is warm and dry.  Neurological:     General: No focal deficit present.     Mental Status: He is alert and oriented to person, place, and time. Mental status is at baseline.  Psychiatric:        Mood and Affect: Mood normal.        Behavior: Behavior normal.        Thought Content: Thought content normal.        Judgment: Judgment normal.     LABS:      Latest Ref Rng & Units 08/03/2023    1:34 PM 10/02/2017    9:30 AM 08/31/2014    8:56 PM  CBC  WBC 4.0 - 10.5 K/uL 10.7  12.1  11.1   Hemoglobin 13.0 - 17.0 g/dL 04.5  40.9  81.1   Hematocrit 39.0 - 52.0 % 49.6  47.9  46.3   Platelets 150 - 400 K/uL 173  175  160       Latest Ref Rng & Units 08/03/2023    1:34 PM 10/02/2017    9:30 AM 08/31/2014    8:56 PM  CMP  Glucose 70 - 99 mg/dL 914  782  956   BUN 8 - 23 mg/dL 18  11  15    Creatinine 0.61 - 1.24 mg/dL 2.13  0.86  5.78   Sodium 135 - 145 mmol/L 138  138  139   Potassium 3.5 - 5.1 mmol/L 3.8  4.1  3.7   Chloride 98 - 111 mmol/L 99  103  103   CO2 22 - 32 mmol/L 27  26  25    Calcium 8.9 - 10.3 mg/dL 46.9  9.7  9.9   Total Protein 6.5 - 8.1 g/dL 7.6  7.8  7.7   Total Bilirubin 0.0 - 1.2 mg/dL 1.0  1.4  0.7   Alkaline Phos 38 - 126 U/L 84  55  57   AST 15 - 41 U/L 25  19  29    ALT 0 - 44 U/L 30  15  24       No results found for: "CEA1", "CEA" / No results found for: "CEA1", "CEA" No results found for: "PSA1" No results found for: "GEX528" No results found for: "CAN125"  No results found for: "TOTALPROTELP", "ALBUMINELP", "A1GS", "A2GS", "BETS", "BETA2SER", "GAMS", "MSPIKE", "SPEI" No results found  for: "TIBC",  "FERRITIN", "IRONPCTSAT" No results found for: "LDH"  STUDIES:  NM PET Image Initial (PI) Skull Base To Thigh Result Date: 08/17/2023 CLINICAL DATA:  Initial treatment strategy for spinal lesion. EXAM: NUCLEAR MEDICINE PET SKULL BASE TO THIGH TECHNIQUE: 8.78 mCi F-18 FDG was injected intravenously. Full-ring PET imaging was performed from the skull base to thigh after the radiotracer. CT data was obtained and used for attenuation correction and anatomic localization. Fasting blood glucose: 156 mg/dl COMPARISON:  Lumbar spine CT 07/26/2023. FINDINGS: Mediastinal blood pool activity: SUV max 3.4 Liver activity: SUV max 4.0 NECK: No specific abnormal uptake seen in the neck including along lymph node change of the submandibular, posterior triangle or internal jugular regions. Near symmetric uptake of the visualized intracranial compartment. Incidental CT findings: Slight mucosal thickening along the left maxillary sinus inferiorly. Small rounded focus in the right maxillary sinus, possible small mucous retention cyst. Mastoid air cells are clear. Scattered vascular calcifications in the neck and head. The parotid glands, submandibular glands are unremarkable. Small thyroid  gland. Streak artifact related to the fixation hardware along the lower cervical spine. CHEST: No abnormal uptake above mediastinal blood pool in the axillary regions, hilum or mediastinum. No abnormal lung uptake. Incidental CT findings: Elevated right hemidiaphragm. Coronary artery calcifications are seen. Trace pericardial fluid or thickening. The heart is nonenlarged. Thoracic aorta is normal course and caliber with some scattered calcified plaque. Slightly patulous thoracic esophagus. Bandlike opacity at the right lung base. Favoring atelectasis or scar. Mild emphysematous lung changes identified in the upper lung zones, centrilobular. No pleural effusion or consolidation. No pneumothorax. Some breathing motion. ABDOMEN/PELVIS: There is  physiologic distribution radiotracer along the parenchymal organs, bowel and renal collecting systems. Incidental CT findings: Multiple hepatic cystic lesions are identified. Stone in the nondilated gallbladder. Grossly the spleen, adrenal glands and pancreas are unremarkable. Punctate nonobstructing lower pole right-sided renal stone. Preserved contour to the urinary bladder. Lobular appearance to the prostate. No abnormal uptake. Large bowel is normal course and caliber. Scattered colonic stool. Stomach and small bowel are nondilated. SKELETON: No areas of abnormal radiotracer uptake identified along the osseous structures. The sclerotic lesion identified in the L3 vertebral body is again seen today. No specific abnormal uptake. In addition going back to an old CT scan of Aug 31, 2014 this lesion was present at that time although slightly smaller. However this has been present for close to 9 years. Incidental CT findings: Scattered degenerative changes. IMPRESSION: No areas of abnormal radiotracer uptake. The sclerotic lesion at L3 does not show specific abnormal uptake. Of note this has been present although smaller in 2016, present for close to 9 years. Electronically Signed   By: Adrianna Horde M.D.   On: 08/17/2023 14:33      HISTORY:   Past Medical History:  Diagnosis Date   Diabetes mellitus without complication (HCC)    Hypertension    Sciatica    Thyroid  disease    Had radioactive iodine    Past Surgical History:  Procedure Laterality Date   FRACTURE SURGERY     Left leg, with rod   HERNIA REPAIR     NECK SURGERY     Rod in the neck    Family History  Problem Relation Age of Onset   Hypertension Mother    Dementia Mother    Stroke Father    Cancer Sister        Blood Cancer, can't remember what it was   Thyroid  disease Brother  Thyroid  disease Brother    Hashimoto's thyroiditis Daughter     Social History:  reports that he has quit smoking. His smoking use included  cigarettes. He has quit using smokeless tobacco. He reports current alcohol use. He reports that he does not use drugs.The patient is accompanied by wife today.  Allergies: No Known Allergies  Current Medications: Current Outpatient Medications  Medication Sig Dispense Refill   amLODipine (NORVASC) 10 MG tablet Take 10 mg by mouth daily.     CRESTOR 20 MG tablet Take 20 mg by mouth daily.  0   ibuprofen (ADVIL) 800 MG tablet Take 800 mg by mouth 3 (three) times daily as needed.     Ipratropium-Albuterol (COMBIVENT RESPIMAT) 20-100 MCG/ACT AERS respimat Inhale 1 puff into the lungs every 6 (six) hours.     MOUNJARO 15 MG/0.5ML Pen Inject 15 mg into the skin once a week.     oxyCODONE (OXY IR/ROXICODONE) 5 MG immediate release tablet Take 5 mg by mouth every 8 (eight) hours as needed.     SYNTHROID 137 MCG tablet Take 137 mcg by mouth See admin instructions.   0   No current facility-administered medications for this visit.

## 2023-08-25 DIAGNOSIS — M545 Low back pain, unspecified: Secondary | ICD-10-CM | POA: Diagnosis not present

## 2023-08-26 DIAGNOSIS — E1129 Type 2 diabetes mellitus with other diabetic kidney complication: Secondary | ICD-10-CM | POA: Diagnosis not present

## 2023-08-30 DIAGNOSIS — N1831 Chronic kidney disease, stage 3a: Secondary | ICD-10-CM | POA: Diagnosis not present

## 2023-08-30 DIAGNOSIS — R351 Nocturia: Secondary | ICD-10-CM | POA: Diagnosis not present

## 2023-08-30 DIAGNOSIS — R972 Elevated prostate specific antigen [PSA]: Secondary | ICD-10-CM | POA: Diagnosis not present

## 2023-08-30 DIAGNOSIS — Z79899 Other long term (current) drug therapy: Secondary | ICD-10-CM | POA: Diagnosis not present

## 2023-08-30 DIAGNOSIS — N401 Enlarged prostate with lower urinary tract symptoms: Secondary | ICD-10-CM | POA: Diagnosis not present

## 2023-10-03 DIAGNOSIS — E559 Vitamin D deficiency, unspecified: Secondary | ICD-10-CM | POA: Diagnosis not present

## 2023-10-03 DIAGNOSIS — Z79899 Other long term (current) drug therapy: Secondary | ICD-10-CM | POA: Diagnosis not present

## 2023-10-03 DIAGNOSIS — E039 Hypothyroidism, unspecified: Secondary | ICD-10-CM | POA: Diagnosis not present

## 2023-10-03 DIAGNOSIS — R809 Proteinuria, unspecified: Secondary | ICD-10-CM | POA: Diagnosis not present

## 2023-10-03 DIAGNOSIS — E1129 Type 2 diabetes mellitus with other diabetic kidney complication: Secondary | ICD-10-CM | POA: Diagnosis not present

## 2023-10-10 DIAGNOSIS — E7800 Pure hypercholesterolemia, unspecified: Secondary | ICD-10-CM | POA: Diagnosis not present

## 2023-10-10 DIAGNOSIS — E890 Postprocedural hypothyroidism: Secondary | ICD-10-CM | POA: Diagnosis not present

## 2023-10-10 DIAGNOSIS — E118 Type 2 diabetes mellitus with unspecified complications: Secondary | ICD-10-CM | POA: Diagnosis not present

## 2023-10-10 DIAGNOSIS — M51362 Other intervertebral disc degeneration, lumbar region with discogenic back pain and lower extremity pain: Secondary | ICD-10-CM | POA: Diagnosis not present

## 2023-10-10 DIAGNOSIS — E785 Hyperlipidemia, unspecified: Secondary | ICD-10-CM | POA: Diagnosis not present

## 2023-10-10 DIAGNOSIS — E559 Vitamin D deficiency, unspecified: Secondary | ICD-10-CM | POA: Diagnosis not present

## 2023-10-10 DIAGNOSIS — I10 Essential (primary) hypertension: Secondary | ICD-10-CM | POA: Diagnosis not present

## 2023-10-11 DIAGNOSIS — N1831 Chronic kidney disease, stage 3a: Secondary | ICD-10-CM | POA: Diagnosis not present

## 2023-10-11 DIAGNOSIS — R351 Nocturia: Secondary | ICD-10-CM | POA: Diagnosis not present

## 2023-10-11 DIAGNOSIS — R972 Elevated prostate specific antigen [PSA]: Secondary | ICD-10-CM | POA: Diagnosis not present

## 2023-10-11 DIAGNOSIS — N401 Enlarged prostate with lower urinary tract symptoms: Secondary | ICD-10-CM | POA: Diagnosis not present

## 2023-11-23 DIAGNOSIS — N1831 Chronic kidney disease, stage 3a: Secondary | ICD-10-CM | POA: Diagnosis not present

## 2023-11-23 DIAGNOSIS — N401 Enlarged prostate with lower urinary tract symptoms: Secondary | ICD-10-CM | POA: Diagnosis not present

## 2023-11-23 DIAGNOSIS — R972 Elevated prostate specific antigen [PSA]: Secondary | ICD-10-CM | POA: Diagnosis not present

## 2023-11-23 DIAGNOSIS — R351 Nocturia: Secondary | ICD-10-CM | POA: Diagnosis not present

## 2023-12-02 DIAGNOSIS — H35372 Puckering of macula, left eye: Secondary | ICD-10-CM | POA: Diagnosis not present

## 2023-12-02 DIAGNOSIS — H35033 Hypertensive retinopathy, bilateral: Secondary | ICD-10-CM | POA: Diagnosis not present

## 2023-12-02 DIAGNOSIS — E113291 Type 2 diabetes mellitus with mild nonproliferative diabetic retinopathy without macular edema, right eye: Secondary | ICD-10-CM | POA: Diagnosis not present

## 2023-12-02 DIAGNOSIS — E0500 Thyrotoxicosis with diffuse goiter without thyrotoxic crisis or storm: Secondary | ICD-10-CM | POA: Diagnosis not present

## 2023-12-07 DIAGNOSIS — B029 Zoster without complications: Secondary | ICD-10-CM | POA: Diagnosis not present

## 2024-01-09 DIAGNOSIS — E559 Vitamin D deficiency, unspecified: Secondary | ICD-10-CM | POA: Diagnosis not present

## 2024-01-09 DIAGNOSIS — I10 Essential (primary) hypertension: Secondary | ICD-10-CM | POA: Diagnosis not present

## 2024-01-09 DIAGNOSIS — E890 Postprocedural hypothyroidism: Secondary | ICD-10-CM | POA: Diagnosis not present

## 2024-01-09 DIAGNOSIS — E118 Type 2 diabetes mellitus with unspecified complications: Secondary | ICD-10-CM | POA: Diagnosis not present

## 2024-01-17 DIAGNOSIS — R351 Nocturia: Secondary | ICD-10-CM | POA: Diagnosis not present

## 2024-01-17 DIAGNOSIS — N401 Enlarged prostate with lower urinary tract symptoms: Secondary | ICD-10-CM | POA: Diagnosis not present

## 2024-01-17 DIAGNOSIS — R972 Elevated prostate specific antigen [PSA]: Secondary | ICD-10-CM | POA: Diagnosis not present

## 2024-01-17 DIAGNOSIS — N1831 Chronic kidney disease, stage 3a: Secondary | ICD-10-CM | POA: Diagnosis not present

## 2024-01-19 DIAGNOSIS — I10 Essential (primary) hypertension: Secondary | ICD-10-CM | POA: Diagnosis not present

## 2024-01-19 DIAGNOSIS — B0229 Other postherpetic nervous system involvement: Secondary | ICD-10-CM | POA: Diagnosis not present

## 2024-01-19 DIAGNOSIS — E559 Vitamin D deficiency, unspecified: Secondary | ICD-10-CM | POA: Diagnosis not present

## 2024-01-19 DIAGNOSIS — E890 Postprocedural hypothyroidism: Secondary | ICD-10-CM | POA: Diagnosis not present

## 2024-01-19 DIAGNOSIS — E118 Type 2 diabetes mellitus with unspecified complications: Secondary | ICD-10-CM | POA: Diagnosis not present

## 2024-01-19 DIAGNOSIS — Z0000 Encounter for general adult medical examination without abnormal findings: Secondary | ICD-10-CM | POA: Diagnosis not present

## 2024-02-28 DIAGNOSIS — N3289 Other specified disorders of bladder: Secondary | ICD-10-CM | POA: Diagnosis not present

## 2024-02-28 DIAGNOSIS — R972 Elevated prostate specific antigen [PSA]: Secondary | ICD-10-CM | POA: Diagnosis not present

## 2024-02-28 DIAGNOSIS — R351 Nocturia: Secondary | ICD-10-CM | POA: Diagnosis not present

## 2024-02-28 DIAGNOSIS — N1831 Chronic kidney disease, stage 3a: Secondary | ICD-10-CM | POA: Diagnosis not present

## 2024-02-28 DIAGNOSIS — N401 Enlarged prostate with lower urinary tract symptoms: Secondary | ICD-10-CM | POA: Diagnosis not present
